# Patient Record
Sex: Female | Born: 1945 | Race: White | Hispanic: No | Marital: Married | Smoking: Former smoker
Health system: Southern US, Community
[De-identification: ages and names within clinical notes are randomized; demographics above are authoritative.]

## PROBLEM LIST (undated history)

## (undated) DIAGNOSIS — J189 Pneumonia, unspecified organism: Secondary | ICD-10-CM

## (undated) DIAGNOSIS — E119 Type 2 diabetes mellitus without complications: Secondary | ICD-10-CM

## (undated) DIAGNOSIS — M1990 Unspecified osteoarthritis, unspecified site: Secondary | ICD-10-CM

## (undated) DIAGNOSIS — I10 Essential (primary) hypertension: Secondary | ICD-10-CM

## (undated) DIAGNOSIS — K219 Gastro-esophageal reflux disease without esophagitis: Secondary | ICD-10-CM

## (undated) DIAGNOSIS — E039 Hypothyroidism, unspecified: Secondary | ICD-10-CM

## (undated) DIAGNOSIS — E079 Disorder of thyroid, unspecified: Secondary | ICD-10-CM

## (undated) DIAGNOSIS — E785 Hyperlipidemia, unspecified: Secondary | ICD-10-CM

## (undated) HISTORY — DX: Type 2 diabetes mellitus without complications: E11.9

## (undated) HISTORY — DX: Gastro-esophageal reflux disease without esophagitis: K21.9

## (undated) HISTORY — DX: Morbid (severe) obesity due to excess calories: E66.01

## (undated) HISTORY — DX: Essential (primary) hypertension: I10

## (undated) HISTORY — PX: BACK SURGERY: SHX140

## (undated) HISTORY — DX: Hyperlipidemia, unspecified: E78.5

## (undated) HISTORY — DX: Disorder of thyroid, unspecified: E07.9

---

## 2000-01-15 HISTORY — PX: ABDOMINAL HYSTERECTOMY: SHX81

## 2004-01-11 ENCOUNTER — Encounter: Admission: RE | Admit: 2004-01-11 | Discharge: 2004-01-11 | Payer: Self-pay | Admitting: *Deleted

## 2004-01-13 ENCOUNTER — Encounter: Admission: RE | Admit: 2004-01-13 | Discharge: 2004-01-13 | Payer: Self-pay | Admitting: *Deleted

## 2004-01-13 ENCOUNTER — Ambulatory Visit (HOSPITAL_COMMUNITY): Admission: RE | Admit: 2004-01-13 | Discharge: 2004-01-13 | Payer: Self-pay | Admitting: *Deleted

## 2004-01-20 ENCOUNTER — Ambulatory Visit (HOSPITAL_COMMUNITY): Admission: RE | Admit: 2004-01-20 | Discharge: 2004-01-20 | Payer: Self-pay | Admitting: *Deleted

## 2004-03-22 ENCOUNTER — Encounter: Admission: RE | Admit: 2004-03-22 | Discharge: 2004-06-20 | Payer: Self-pay | Admitting: *Deleted

## 2004-04-03 ENCOUNTER — Observation Stay (HOSPITAL_COMMUNITY): Admission: RE | Admit: 2004-04-03 | Discharge: 2004-04-04 | Payer: Self-pay | Admitting: *Deleted

## 2004-04-03 HISTORY — PX: LAPAROSCOPIC GASTRIC BANDING: SHX1100

## 2004-08-02 ENCOUNTER — Encounter: Admission: RE | Admit: 2004-08-02 | Discharge: 2004-10-31 | Payer: Self-pay | Admitting: *Deleted

## 2005-01-16 ENCOUNTER — Encounter: Admission: RE | Admit: 2005-01-16 | Discharge: 2005-04-16 | Payer: Self-pay | Admitting: *Deleted

## 2006-01-27 ENCOUNTER — Ambulatory Visit (HOSPITAL_COMMUNITY): Admission: RE | Admit: 2006-01-27 | Discharge: 2006-01-27 | Payer: Self-pay | Admitting: *Deleted

## 2008-11-08 HISTORY — PX: BLADDER SUSPENSION: SHX72

## 2011-07-25 ENCOUNTER — Telehealth (INDEPENDENT_AMBULATORY_CARE_PROVIDER_SITE_OTHER): Payer: Self-pay

## 2011-07-25 NOTE — Telephone Encounter (Signed)
Dr Harlin Heys request pt be seen asap in our office to have all fluid be removed from lap band. They are wanting pt to go to [facility] as a referral but pt must have all fluid removed from band prior to this appt. I advised Coralee North at Dr Luther Hearing office that we have not seen pt seen 2007 and I will have chart requested from storage and have a bariatric MD review chart and advise where to place pt in schedule. Coralee North can be reached at [phone].

## 2011-08-09 ENCOUNTER — Telehealth (INDEPENDENT_AMBULATORY_CARE_PROVIDER_SITE_OTHER): Payer: Self-pay

## 2011-08-09 NOTE — Telephone Encounter (Signed)
Left message advising patient that we have an opening this afternoon at 4:45pm due to a cancellation with Dr. Johna Sheriff

## 2011-08-13 ENCOUNTER — Telehealth (INDEPENDENT_AMBULATORY_CARE_PROVIDER_SITE_OTHER): Payer: Self-pay

## 2011-08-13 NOTE — Telephone Encounter (Signed)
Called Ms. Nicolosi to offer an appointment in our [facility] on 08/22/11, patient states she had all her fluid removed from a physician in [facility] and had her records transferred.  Patient is aware to call our office if needed.

## 2011-09-24 HISTORY — PX: SPLENECTOMY, TOTAL: SHX788

## 2011-09-24 HISTORY — PX: PANCREAS SURGERY: SHX731

## 2012-05-06 HISTORY — PX: LUNG SURGERY: SHX703

## 2012-05-20 HISTORY — PX: COLONOSCOPY: SHX174

## 2012-10-22 ENCOUNTER — Other Ambulatory Visit (INDEPENDENT_AMBULATORY_CARE_PROVIDER_SITE_OTHER): Payer: Self-pay

## 2012-10-22 ENCOUNTER — Encounter (INDEPENDENT_AMBULATORY_CARE_PROVIDER_SITE_OTHER): Payer: Self-pay | Admitting: Surgery

## 2012-10-22 ENCOUNTER — Ambulatory Visit (INDEPENDENT_AMBULATORY_CARE_PROVIDER_SITE_OTHER): Payer: Medicare Other | Admitting: Surgery

## 2012-10-22 VITALS — BP 156/88 | HR 80 | Temp 98.0°F | Resp 16 | Ht 65.0 in | Wt 239.2 lb

## 2012-10-22 DIAGNOSIS — E669 Obesity, unspecified: Secondary | ICD-10-CM

## 2012-10-22 DIAGNOSIS — R4702 Dysphasia: Secondary | ICD-10-CM

## 2012-10-22 DIAGNOSIS — K219 Gastro-esophageal reflux disease without esophagitis: Secondary | ICD-10-CM

## 2012-10-22 DIAGNOSIS — R109 Unspecified abdominal pain: Secondary | ICD-10-CM

## 2012-10-22 DIAGNOSIS — E7800 Pure hypercholesterolemia, unspecified: Secondary | ICD-10-CM

## 2012-10-22 DIAGNOSIS — Z9884 Bariatric surgery status: Secondary | ICD-10-CM

## 2012-10-22 NOTE — Progress Notes (Signed)
Chief Complaint:  Weight regain; failed lapband  History of Present Illness:  Carol Jackson is an 67 y.o. female who had a 10 cm Lapband in 2005.  She lost 85 lbs at her nadir but has had weight regain to within 27 lbs of her preop weight.  She is in the midst of her 6 month supervised weight loss.  She is going to curves and exercising regularly, she is going to diet-controlled centers and has not been able to lose any further weight.  Will begin workup that hopefully can culminate in her having a conversion of her lap band to a Roux-en-Y gastric bypass at the end of her 6 month weight loss supervision.   Past Medical History  Diagnosis Date  . Diabetes mellitus without complication   . GERD (gastroesophageal reflux disease)   . Hyperlipidemia   . Hypertension   . Thyroid disease     hypothyroidism    Past Surgical History  Procedure Laterality Date  . Laparoscopic gastric banding  04/03/04  . Abdominal hysterectomy  01/15/00  . Bladder suspension  11/08/08  . 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 Pancreas surgery  09/24/11    tail end removed  . Lung surgery  05/06/12    Current Outpatient Prescriptions  Medication Sig Dispense Refill  . amLODipine (NORVASC) 10 MG tablet Take 10 mg by mouth daily.      Marland Kitchen aspirin 81 MG tablet Take 81 mg by mouth daily.      . Cholecalciferol (VITAMIN D-3) 1000 UNITS CAPS Take by mouth.      . Cyanocobalamin (VITAMIN B-12) 2500 MCG SUBL Place under the tongue.      Marland Kitchen Fe Bisgly-Succ-C-Thre-B12-FA (IRON 21/7 PO) Take by mouth.      Marland Kitchen glucose blood test strip 1 each by Other route as needed for other. Use as instructed      . Krill Oil Omega-3 300 MG CAPS Take by mouth.      . Lancets MISC by Does not apply route.      Marland Kitchen levothyroxine (SYNTHROID, LEVOTHROID) 50 MCG tablet Take 50 mcg by mouth daily before breakfast.      . loratadine (CLARITIN) 10 MG tablet Take 10 mg by mouth daily.      . metFORMIN (GLUCOPHAGE) 500 MG tablet Take 500 mg by mouth 2  (two) times daily with a meal.      . metoCLOPramide (REGLAN) 10 MG tablet Take 10 mg by mouth 4 (four) times daily.      Marland Kitchen omeprazole (PRILOSEC) 20 MG capsule Take 20 mg by mouth daily.      . potassium chloride (KLOR-CON) 20 MEQ packet Take 20 mEq by mouth 2 (two) times daily.      . simvastatin (ZOCOR) 40 MG tablet Take 40 mg by mouth every evening.       No current facility-administered medications for this visit.   Morphine and related Family History  Problem Relation Age of Onset  . Heart disease Father    Social History:   reports that she has quit smoking. She does not have any smokeless tobacco history on file. She reports that  drinks alcohol. She reports that she does not use illicit drugs.   REVIEW OF SYSTEMS - PERTINENT POSITIVES ONLYhad pneumonia and sounds like a pleural effusion.  Physical Exam:   Blood pressure 156/88, pulse 80, temperature 98 F (36.7 C), temperature source Temporal, resp. rate 16, height 5\' 5"  (1.651 m), weight 239 lb 3.2 oz (  108.5 kg). Body mass index is 39.8 kg/(m^2).  Gen:  WDWN white female NAD  Neurological: Alert and oriented to person, place, and time. Motor and sensory function is grossly intact  Head: Normocephalic and atraumatic.  Eyes: Conjunctivae are normal. Pupils are equal, round, and reactive to light. No scleral icterus.  Neck: Normal range of motion. Neck supple. No tracheal deviation or thyromegaly present.  Cardiovascular:  SR without murmurs or gallops.  No carotid bruits Respiratory: Effort normal.  No respiratory distress. No chest wall tenderness. Breath sounds normal.  No wheezes, rales or rhonchi.  Abdomen:  nontender GU: Musculoskeletal: Normal range of motion. Extremities are nontender. No cyanosis, edema or clubbing noted Lymphadenopathy: No cervical, preauricular, postauricular or axillary adenopathy is present Skin: Skin is warm and dry. No rash noted. No diaphoresis. No erythema. No pallor. Pscyh: Normal mood and  affect. Behavior is normal. Judgment and thought content normal.   LABORATORY RESULTS: No results found for this or any previous visit (from the past 48 hour(s)).  RADIOLOGY RESULTS: No results found.  Problem List: There are no active problems to display for this patient.   Assessment & Plan: LAP-BAND failure.  To begin workup for conversion to Roux-en-Y gastric bypass    Matt B. Daphine Deutscher, MD, [facility] [phone] beeper [phone]  10/22/2012 10:26 AM

## 2012-10-22 NOTE — Patient Instructions (Signed)

## 2012-11-12 ENCOUNTER — Encounter (INDEPENDENT_AMBULATORY_CARE_PROVIDER_SITE_OTHER): Payer: Self-pay

## 2012-11-24 ENCOUNTER — Ambulatory Visit: Payer: Self-pay | Admitting: *Deleted

## 2012-11-26 ENCOUNTER — Ambulatory Visit (HOSPITAL_COMMUNITY)
Admission: RE | Admit: 2012-11-26 | Discharge: 2012-11-26 | Disposition: A | Discharge status: Home / Self Care | Payer: Medicare Other | Source: Ambulatory Visit | Attending: Surgery | Admitting: Surgery

## 2012-11-26 ENCOUNTER — Other Ambulatory Visit: Payer: Self-pay

## 2012-11-26 ENCOUNTER — Ambulatory Visit (HOSPITAL_COMMUNITY): Payer: Medicare Other

## 2012-11-26 DIAGNOSIS — K7689 Other specified diseases of liver: Secondary | ICD-10-CM | POA: Insufficient documentation

## 2012-11-26 DIAGNOSIS — E785 Hyperlipidemia, unspecified: Secondary | ICD-10-CM | POA: Insufficient documentation

## 2012-11-26 DIAGNOSIS — Z90411 Acquired partial absence of pancreas: Secondary | ICD-10-CM | POA: Insufficient documentation

## 2012-11-26 DIAGNOSIS — I10 Essential (primary) hypertension: Secondary | ICD-10-CM | POA: Insufficient documentation

## 2012-11-26 DIAGNOSIS — K219 Gastro-esophageal reflux disease without esophagitis: Secondary | ICD-10-CM | POA: Insufficient documentation

## 2012-11-26 DIAGNOSIS — E119 Type 2 diabetes mellitus without complications: Secondary | ICD-10-CM | POA: Insufficient documentation

## 2012-11-26 DIAGNOSIS — K228 Other specified diseases of esophagus: Secondary | ICD-10-CM | POA: Insufficient documentation

## 2012-11-26 DIAGNOSIS — Z6839 Body mass index (BMI) 39.0-39.9, adult: Secondary | ICD-10-CM | POA: Insufficient documentation

## 2012-11-26 DIAGNOSIS — J90 Pleural effusion, not elsewhere classified: Secondary | ICD-10-CM | POA: Insufficient documentation

## 2012-11-26 DIAGNOSIS — K449 Diaphragmatic hernia without obstruction or gangrene: Secondary | ICD-10-CM | POA: Insufficient documentation

## 2012-11-26 DIAGNOSIS — E039 Hypothyroidism, unspecified: Secondary | ICD-10-CM | POA: Insufficient documentation

## 2012-11-26 DIAGNOSIS — K2289 Other specified disease of esophagus: Secondary | ICD-10-CM | POA: Insufficient documentation

## 2012-11-26 DIAGNOSIS — Z9089 Acquired absence of other organs: Secondary | ICD-10-CM | POA: Insufficient documentation

## 2012-12-02 ENCOUNTER — Encounter (INDEPENDENT_AMBULATORY_CARE_PROVIDER_SITE_OTHER): Payer: Self-pay

## 2012-12-03 ENCOUNTER — Encounter: Payer: Self-pay | Admitting: *Deleted

## 2012-12-03 ENCOUNTER — Encounter: Payer: Medicare Other | Attending: Surgery | Admitting: *Deleted

## 2012-12-03 VITALS — Ht 65.0 in | Wt 241.6 lb

## 2012-12-03 DIAGNOSIS — Z713 Dietary counseling and surveillance: Secondary | ICD-10-CM | POA: Insufficient documentation

## 2012-12-03 DIAGNOSIS — E669 Obesity, unspecified: Secondary | ICD-10-CM | POA: Insufficient documentation

## 2012-12-03 NOTE — Progress Notes (Signed)
  Pre-Op Assessment Visit:  Pre-Operative RYGB Surgery  Medical Nutrition Therapy:  Appt start time: 0830   End time:  0930.  Patient was seen on 12/03/2012 for Pre-Operative Gastric Bypass Nutrition Assessment. Assessment and letter of approval faxed to [facility] Bariatric Surgery Program coordinator on 12/03/2012.   Handouts given during visit include:  Pre-Op Goals Bariatric Surgery Protein Shakes  Patient to call the [facility] to enroll in Pre-Op and Post-Op Nutrition Education when surgery date is scheduled.

## 2012-12-03 NOTE — Patient Instructions (Addendum)
   Follow Pre-Op Nutrition Goals to prepare for Gastric Bypass Surgery.   Call the [facility] at [phone] once you have been given your surgery date to enrolled in the Pre-Op Nutrition Class. You will need to attend this nutrition class 3-4 weeks prior to your surgery. 

## 2013-02-16 ENCOUNTER — Encounter (INDEPENDENT_AMBULATORY_CARE_PROVIDER_SITE_OTHER): Payer: Self-pay

## 2013-09-29 ENCOUNTER — Encounter (INDEPENDENT_AMBULATORY_CARE_PROVIDER_SITE_OTHER): Payer: Self-pay | Admitting: Surgery

## 2013-09-29 ENCOUNTER — Ambulatory Visit (INDEPENDENT_AMBULATORY_CARE_PROVIDER_SITE_OTHER): Payer: Medicare Other | Admitting: Surgery

## 2013-09-29 VITALS — BP 136/100 | HR 88 | Temp 97.8°F | Ht 66.0 in | Wt 249.0 lb

## 2013-09-29 DIAGNOSIS — E891 Postprocedural hypoinsulinemia: Secondary | ICD-10-CM

## 2013-09-29 DIAGNOSIS — E139 Other specified diabetes mellitus without complications: Secondary | ICD-10-CM | POA: Insufficient documentation

## 2013-09-29 DIAGNOSIS — E119 Type 2 diabetes mellitus without complications: Secondary | ICD-10-CM

## 2013-09-29 DIAGNOSIS — Z90410 Acquired total absence of pancreas: Secondary | ICD-10-CM

## 2013-09-29 DIAGNOSIS — L905 Scar conditions and fibrosis of skin: Secondary | ICD-10-CM | POA: Insufficient documentation

## 2013-09-29 NOTE — Progress Notes (Signed)
Chief Complaint:  Obesity, DM, and failed lapband  History of Present Illness:  Carol Jackson is an 68 y.o. female who underwent Lapband surgery in 2005 and then the long-term has failed. Current weight is 249 despite 6 months supervised weight loss. 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 Subsequently she developed an empyema on the right side and underwent a decortication to wait for as well.  She's researched this l on the Internet about that conversion and wants to go and have her band removed and converted to Roux-en-Y gastric bypass. I explained this is a more complicated and he does have risks but she feels this is necessary and I agree.  Past Medical History  Diagnosis Date  . Diabetes mellitus without complication   . GERD (gastroesophageal reflux disease)   . Hyperlipidemia   . Hypertension   . Thyroid disease     hypothyroidism  . Morbid obesity     Past Surgical History  Procedure Laterality Date  . Laparoscopic gastric banding  04/03/04  . Abdominal hysterectomy  01/15/00  . Bladder suspension  11/08/08  . 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 Pancreas surgery  09/24/11    tail end removed  . Lung surgery  05/06/12    Current Outpatient Prescriptions  Medication Sig Dispense Refill  . amLODipine (NORVASC) 10 MG tablet Take 10 mg by mouth daily.      Marland Kitchen aspirin 81 MG tablet Take 81 mg by mouth daily.      . Cholecalciferol (VITAMIN D-3) 1000 UNITS CAPS Take by mouth.      . Cyanocobalamin (VITAMIN B-12) 2500 MCG SUBL Place under the tongue.      Marland Kitchen Fe Bisgly-Succ-C-Thre-B12-FA (IRON 21/7 PO) Take 27 mg by mouth daily.       Marland Kitchen glucose blood test strip 1 each by Other route as needed for other. Use as instructed      . Krill Oil Omega-3 300 MG CAPS Take by mouth.      . Lancets MISC by Does not apply route.      Marland Kitchen levothyroxine (SYNTHROID, LEVOTHROID) 50 MCG  tablet Take 50 mcg by mouth daily before breakfast.      . loratadine (CLARITIN) 10 MG tablet Take 10 mg by mouth daily.      . metFORMIN (GLUCOPHAGE) 500 MG tablet Take 500 mg by mouth 2 (two) times daily with a meal.      . metoCLOPramide (REGLAN) 10 MG tablet Take 10 mg by mouth 4 (four) times daily.      Marland Kitchen omeprazole (PRILOSEC) 20 MG capsule Take 20 mg by mouth daily.      . potassium chloride (KLOR-CON) 20 MEQ packet Take 20 mEq by mouth 2 (two) times daily.      . simvastatin (ZOCOR) 40 MG tablet Take 40 mg by mouth every evening.       No current facility-administered medications for this visit.   Morphine and related and Levaquin Family History  Problem Relation Age of Onset  . Heart disease Father    Social History:   reports that she quit smoking about 29 years ago. Her smoking use included Cigarettes. She smoked 0.00 packs per day. She does not have any smokeless tobacco history on file. She reports that she drinks alcohol. She reports that she does not use illicit drugs.   REVIEW OF SYSTEMS - PERTINENT POSITIVES ONLY: Negative for DVT  Physical  Exam:   Blood pressure 136/100, pulse 88, temperature 97.8 F (36.6 C), height 5\' 6"  (1.676 m), weight 249 lb (112.946 kg). Body mass index is 40.21 kg/(m^2).  Gen:  WDWN WF NAD  Neurological: Alert and oriented to person, place, and time. Motor and sensory function is grossly intact  Head: Normocephalic and atraumatic.  Eyes: Conjunctivae are normal. Pupils are equal, round, and reactive to light. No scleral icterus.  Neck: Normal range of motion. Neck supple. No tracheal deviation or thyromegaly present.  Cardiovascular:  SR without murmurs or gallops.  No carotid bruits Respiratory: Effort normal.  No respiratory distress. No chest wall tenderness. Breath sounds normal.  No wheezes, rales or rhonchi.  Abdomen:  nontender with multiple laparoscopic scars.   GU: Musculoskeletal: Normal range of motion. Extremities are nontender.  No cyanosis, edema or clubbing noted Lymphadenopathy: No cervical, preauricular, postauricular or axillary adenopathy is present Skin: Skin is warm and dry. No rash noted. No diaphoresis. No erythema. No pallor. Pscyh: Normal mood and affect. Behavior is normal. Judgment and thought content normal.   LABORATORY RESULTS: No results found for this or any previous visit (from the past 48 hour(s)).  RADIOLOGY RESULTS: No results found.  Problem List: Patient Active Problem List   Diagnosis Date Noted  . Diabetes 09/29/2013  . Thoracotomy scar of right chest-decortication at [facility] 09/29/2013  . Post-pancreatectomy diabetes-splenectomy at [facility] 09/29/2013  . 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 Obesity, unspecified 10/22/2012    Assessment & Plan: Failed 10 cm lapband desiring conversion to lap roux en y gastric bypass.  Anticipate scarring in upper abdomen from splenectomy but will move forward with conversion to roux Y    Matt B. Hassell Done, MD, [facility] [phone] beeper [phone]  09/29/2013 2:46 PM

## 2013-09-29 NOTE — Patient Instructions (Addendum)
Two weeks prior to surgery  Go on the extremely low carb liquid diet One week prior to surgery  No aspirin products.  Tylenol is acceptable  Stop smoking 24 hours prior to surgery  No alcoholic beverages  Report fever greater than 100.5 or excessive nasal drainage suggesting infection  Continue bariatric preop diet  Perform bowel prep if ordered  Do not eat or drink anything after midnight the night before surgery  Do not take any medications except those instructed by the anesthesiologist Morning of surgery  Please arrive at the hospital at least 2 hours before your scheduled surgery time.  No makeup, fingernail polish or jewelry  Bring insurance cards with you  Bring your CPAP mask if you use this  Removal of your band and conversion to roux Y gastric bypass carries higher risk of possible conversion to open procedure and for other potential complications.  It will require more time in the operating room and likely at least one night stay in the stepdown unit.

## 2013-10-04 ENCOUNTER — Encounter: Payer: Medicare Other | Attending: Surgery

## 2013-10-04 VITALS — Ht 65.0 in | Wt 250.5 lb

## 2013-10-04 DIAGNOSIS — Z713 Dietary counseling and surveillance: Secondary | ICD-10-CM | POA: Insufficient documentation

## 2013-10-04 DIAGNOSIS — Z01818 Encounter for other preprocedural examination: Secondary | ICD-10-CM | POA: Insufficient documentation

## 2013-10-04 DIAGNOSIS — E669 Obesity, unspecified: Secondary | ICD-10-CM | POA: Diagnosis present

## 2013-10-04 DIAGNOSIS — E119 Type 2 diabetes mellitus without complications: Secondary | ICD-10-CM | POA: Insufficient documentation

## 2013-10-04 NOTE — Progress Notes (Signed)
Dr. Hassell Done - Please enter preop orders in Epic for Carol Jackson - surg date 6/2.  Thanks.

## 2013-10-04 NOTE — Progress Notes (Signed)
  Pre-Operative Nutrition Class:  Appt start time: 830   End time:  1030.  Patient was seen on 10/04/2013 for Pre-Operative Bariatric Surgery Education at the [facility].   Surgery date: 10/19/2013 Surgery type: RYGB Start weight at [facility]: 241.5 lbs on 12/03/12  Weight today: 250.5 lbs  TANITA  BODY COMP RESULTS  10/04/2013   BMI (kg/m^2) 41.7   Fat Mass (lbs) 135   Fat Free Mass (lbs) 115.5   Total Body Water (lbs) 84.5   Samples given per MNT protocol. Patient educated on appropriate usage: Unjury protein powder (unflavored - qty 1) Lot #: [id number] Exp: 10/2014  Bariactiv Calcium Citrate (qty 1) Lot #:[id number] S Exp:10/2014  Bariactiv Multivitamin (qty 1) Lot #:[id number] S Exp:09/2014  Premier protein shake (chocolate - qty 1) Lot #[id number] Exp: 06/2014  The following the learning objectives were met by the patient during this course:  Identify Pre-Op Dietary Goals and will begin 2 weeks pre-operatively  Identify appropriate sources of fluids and proteins   State protein recommendations and appropriate sources pre and post-operatively  Identify Post-Operative Dietary Goals and will follow for 2 weeks post-operatively  Identify appropriate multivitamin and calcium sources  Describe the need for physical activity post-operatively and will follow MD recommendations  State when to call healthcare provider regarding medication questions or post-operative complications  Handouts given during class include:  Pre-Op Bariatric Surgery Diet Handout  Protein Shake Handout  Post-Op Bariatric Surgery Nutrition Handout  BELT Program Information Flyer  Support Group Information Flyer  [facility] Bariatric Supplements Price List  Follow-Up Plan: Patient will follow-up at [facility] 2 weeks post operatively for diet advancement per MD.

## 2013-10-05 ENCOUNTER — Encounter (HOSPITAL_COMMUNITY): Payer: Self-pay | Admitting: Pharmacy Technician

## 2013-10-07 ENCOUNTER — Telehealth: Payer: Self-pay | Admitting: Dietician

## 2013-10-07 NOTE — Progress Notes (Signed)
DR. Hassell Done - PLEASE ENTER PREOP ORDERS IN EPIC FOR Brett Kaiser - HER SURG DATE IS 6/2 AND SHE IS COMING TO [facility] FOR PREOP ON 5/26.  THANKS.

## 2013-10-07 NOTE — Telephone Encounter (Signed)
Email to me on 10/07/2013: Didn't ask the other day, but are sweet potatoes allowed before?   Thanks,    Carol Jackson  My reply on 10/07/2013: Hi Ms. Viernes, Sweet potatoes are too starchy for the pre op diet.  Thanks! Magda Paganini

## 2013-10-08 NOTE — Patient Instructions (Signed)
Carol Jackson  10/08/2013   Your procedure is scheduled on:  10/19/2013  0830am-1150am  Report to [facility] at     [place]  AM.  Call this number if you have problems the morning of surgery: [phone]   Remember:   Do not eat food or drink liquids after midnight.   Take these medicines the morning of surgery with A SIP OF WATER:    Do not wear jewelry, make-up or nail polish.  Do not wear lotions, powders, or perfumes.   Do not shave 48 hours prior to surgery.   Do not bring valuables to the hospital.  Contacts, dentures or bridgework may not be worn into surgery.  Leave suitcase in the car. After surgery it may be brought to your room.  For patients admitted to the hospital, checkout time is 11:00 AM the day of  discharge.          Please read over the following fact sheets that you were given: [facility] - Preparing for Surgery Before surgery, you can play an important role.  Because skin is not sterile, your skin needs to be as free of germs as possible.  You can reduce the number of germs on your skin by washing with CHG (chlorahexidine gluconate) soap before surgery.  CHG is an antiseptic cleaner which kills germs and bonds with the skin to continue killing germs even after washing. Please DO NOT use if you have an allergy to CHG or antibacterial soaps.  If your skin becomes reddened/irritated stop using the CHG and inform your nurse when you arrive at Short Stay. Do not shave (including legs and underarms) for at least 48 hours prior to the first CHG shower.  You may shave your face/neck. Please follow these instructions carefully:  1.  Shower with CHG Soap the night before surgery and the  morning of Surgery.  2.  If you choose to wash your hair, wash your hair first as usual with your  normal  shampoo.  3.  After you shampoo, rinse your hair and body thoroughly to remove the  shampoo.                           4.  Use CHG as you would any other liquid soap.   You can apply chg directly  to the skin and wash                       Gently with a scrungie or clean washcloth.  5.  Apply the CHG Soap to your body ONLY FROM THE NECK DOWN.   Do not use on face/ open                           Wound or open sores. Avoid contact with eyes, ears mouth and genitals (private parts).                       Wash face,  Genitals (private parts) with your normal soap.             6.  Wash thoroughly, paying special attention to the area where your surgery  will be performed.  7.  Thoroughly rinse your body with warm water from the neck down.  8.  DO NOT shower/wash with your normal soap after using and rinsing off  the CHG  Soap.                9.  Pat yourself dry with a clean towel.            10.  Wear clean pajamas.            11.  Place clean sheets on your bed the night of your first shower and do not  sleep with pets. Day of Surgery : Do not apply any lotions/deodorants the morning of surgery.  Please wear clean clothes to the hospital/surgery center.  FAILURE TO FOLLOW THESE INSTRUCTIONS MAY RESULT IN THE CANCELLATION OF YOUR SURGERY PATIENT SIGNATURE_________________________________  NURSE SIGNATURE__________________________________  ________________________________________________________________________  coughing and deep breathing exercises, leg exercises

## 2013-10-12 ENCOUNTER — Encounter (HOSPITAL_COMMUNITY)
Admission: RE | Admit: 2013-10-12 | Discharge: 2013-10-12 | Disposition: A | Discharge status: Home / Self Care | Payer: Medicare Other | Source: Ambulatory Visit | Attending: Surgery | Admitting: Surgery

## 2013-10-12 ENCOUNTER — Encounter (HOSPITAL_COMMUNITY): Payer: Self-pay

## 2013-10-12 DIAGNOSIS — Z01812 Encounter for preprocedural laboratory examination: Secondary | ICD-10-CM | POA: Insufficient documentation

## 2013-10-12 HISTORY — DX: Unspecified osteoarthritis, unspecified site: M19.90

## 2013-10-12 HISTORY — DX: Hypothyroidism, unspecified: E03.9

## 2013-10-12 HISTORY — DX: Pneumonia, unspecified organism: J18.9

## 2013-10-12 LAB — CBC
HEMATOCRIT: 46.3 % — AB (ref 36.0–46.0)
Hemoglobin: 16 g/dL — ABNORMAL HIGH (ref 12.0–15.0)
MCH: 29 pg (ref 26.0–34.0)
MCHC: 34.6 g/dL (ref 30.0–36.0)
MCV: 84 fL (ref 78.0–100.0)
Platelets: 321 10*3/uL (ref 150–400)
RBC: 5.51 MIL/uL — ABNORMAL HIGH (ref 3.87–5.11)
RDW: 15 % (ref 11.5–15.5)
WBC: 9.4 10*3/uL (ref 4.0–10.5)

## 2013-10-12 LAB — BASIC METABOLIC PANEL
BUN: 18 mg/dL (ref 6–23)
CO2: 23 mEq/L (ref 19–32)
Calcium: 9.7 mg/dL (ref 8.4–10.5)
Chloride: 102 mEq/L (ref 96–112)
Creatinine, Ser: 0.66 mg/dL (ref 0.50–1.10)
GFR calc Af Amer: >90 mL/min (ref >90)
GFR calc non Af Amer: >90 mL/min (ref >90)
Glucose, Bld: 112 mg/dL — ABNORMAL HIGH (ref 70–99)
Potassium: 3.8 mEq/L (ref 3.7–5.3)
Sodium: 141 mEq/L (ref 137–147)

## 2013-10-12 NOTE — Progress Notes (Signed)
Preop on 10/12/13.  Surgery on 10/19/2013.  Need orders in EPIC.  Thank You.

## 2013-10-18 ENCOUNTER — Other Ambulatory Visit (INDEPENDENT_AMBULATORY_CARE_PROVIDER_SITE_OTHER): Payer: Self-pay | Admitting: Surgery

## 2013-10-19 ENCOUNTER — Inpatient Hospital Stay (HOSPITAL_COMMUNITY)
Admission: RE | Admit: 2013-10-19 | Discharge: 2013-11-09 | DRG: 326 | Disposition: A | Discharge status: Home Health | Payer: Medicare Other | Source: Ambulatory Visit | Attending: Surgery | Admitting: Surgery

## 2013-10-19 ENCOUNTER — Encounter (HOSPITAL_COMMUNITY): Payer: Medicare Other | Admitting: Certified Registered Nurse Anesthetist

## 2013-10-19 ENCOUNTER — Encounter (HOSPITAL_COMMUNITY)
Admission: RE | Disposition: A | Discharge status: Home Health | Payer: Self-pay | Source: Ambulatory Visit | Attending: Surgery

## 2013-10-19 ENCOUNTER — Encounter (HOSPITAL_COMMUNITY): Payer: Self-pay | Admitting: *Deleted

## 2013-10-19 ENCOUNTER — Inpatient Hospital Stay (HOSPITAL_COMMUNITY): Payer: Medicare Other | Admitting: Certified Registered Nurse Anesthetist

## 2013-10-19 DIAGNOSIS — Z87891 Personal history of nicotine dependence: Secondary | ICD-10-CM

## 2013-10-19 DIAGNOSIS — Z933 Colostomy status: Secondary | ICD-10-CM

## 2013-10-19 DIAGNOSIS — Z79899 Other long term (current) drug therapy: Secondary | ICD-10-CM

## 2013-10-19 DIAGNOSIS — Y833 Surgical operation with formation of external stoma as the cause of abnormal reaction of the patient, or of later complication, without mention of misadventure at the time of the procedure: Secondary | ICD-10-CM | POA: Diagnosis not present

## 2013-10-19 DIAGNOSIS — E039 Hypothyroidism, unspecified: Secondary | ICD-10-CM | POA: Diagnosis present

## 2013-10-19 DIAGNOSIS — K56609 Unspecified intestinal obstruction, unspecified as to partial versus complete obstruction: Secondary | ICD-10-CM | POA: Diagnosis not present

## 2013-10-19 DIAGNOSIS — E876 Hypokalemia: Secondary | ICD-10-CM | POA: Diagnosis not present

## 2013-10-19 DIAGNOSIS — Y838 Other surgical procedures as the cause of abnormal reaction of the patient, or of later complication, without mention of misadventure at the time of the procedure: Secondary | ICD-10-CM | POA: Diagnosis present

## 2013-10-19 DIAGNOSIS — E871 Hypo-osmolality and hyponatremia: Secondary | ICD-10-CM | POA: Diagnosis not present

## 2013-10-19 DIAGNOSIS — D131 Benign neoplasm of stomach: Secondary | ICD-10-CM | POA: Diagnosis present

## 2013-10-19 DIAGNOSIS — Z881 Allergy status to other antibiotic agents status: Secondary | ICD-10-CM

## 2013-10-19 DIAGNOSIS — Z6841 Body Mass Index (BMI) 40.0 and over, adult: Secondary | ICD-10-CM

## 2013-10-19 DIAGNOSIS — E119 Type 2 diabetes mellitus without complications: Secondary | ICD-10-CM

## 2013-10-19 DIAGNOSIS — K9509 Other complications of gastric band procedure: Principal | ICD-10-CM | POA: Diagnosis present

## 2013-10-19 DIAGNOSIS — K2100 Gastro-esophageal reflux disease with esophagitis, without bleeding: Secondary | ICD-10-CM

## 2013-10-19 DIAGNOSIS — Z8249 Family history of ischemic heart disease and other diseases of the circulatory system: Secondary | ICD-10-CM

## 2013-10-19 DIAGNOSIS — K219 Gastro-esophageal reflux disease without esophagitis: Secondary | ICD-10-CM | POA: Diagnosis present

## 2013-10-19 DIAGNOSIS — I10 Essential (primary) hypertension: Secondary | ICD-10-CM | POA: Diagnosis present

## 2013-10-19 DIAGNOSIS — Z9884 Bariatric surgery status: Secondary | ICD-10-CM

## 2013-10-19 DIAGNOSIS — N170 Acute kidney failure with tubular necrosis: Secondary | ICD-10-CM | POA: Diagnosis not present

## 2013-10-19 DIAGNOSIS — I9589 Other hypotension: Secondary | ICD-10-CM | POA: Diagnosis not present

## 2013-10-19 DIAGNOSIS — Z5331 Laparoscopic surgical procedure converted to open procedure: Secondary | ICD-10-CM

## 2013-10-19 DIAGNOSIS — E785 Hyperlipidemia, unspecified: Secondary | ICD-10-CM | POA: Diagnosis present

## 2013-10-19 DIAGNOSIS — Y9210 Unspecified residential institution as the place of occurrence of the external cause: Secondary | ICD-10-CM | POA: Diagnosis not present

## 2013-10-19 DIAGNOSIS — Z885 Allergy status to narcotic agent status: Secondary | ICD-10-CM

## 2013-10-19 DIAGNOSIS — E872 Acidosis, unspecified: Secondary | ICD-10-CM | POA: Diagnosis not present

## 2013-10-19 HISTORY — PX: GASTRIC ROUX-EN-Y: SHX5262

## 2013-10-19 LAB — CBC
HCT: 44.5 % (ref 36.0–46.0)
HEMOGLOBIN: 15.4 g/dL — AB (ref 12.0–15.0)
MCH: 29.1 pg (ref 26.0–34.0)
MCHC: 34.6 g/dL (ref 30.0–36.0)
MCV: 84 fL (ref 78.0–100.0)
Platelets: 295 10*3/uL (ref 150–400)
RBC: 5.3 MIL/uL — ABNORMAL HIGH (ref 3.87–5.11)
RDW: 15.4 % (ref 11.5–15.5)
WBC: 25.3 10*3/uL — ABNORMAL HIGH (ref 4.0–10.5)

## 2013-10-19 LAB — CREATININE, SERUM
Creatinine, Ser: 0.61 mg/dL (ref 0.50–1.10)
GFR calc Af Amer: >90 mL/min (ref >90)
GFR calc non Af Amer: >90 mL/min (ref >90)

## 2013-10-19 LAB — GLUCOSE, CAPILLARY
GLUCOSE-CAPILLARY: 199 mg/dL — AB (ref 70–99)
Glucose-Capillary: 128 mg/dL — ABNORMAL HIGH (ref 70–99)
Glucose-Capillary: 221 mg/dL — ABNORMAL HIGH (ref 70–99)
Glucose-Capillary: 161 mg/dL — ABNORMAL HIGH (ref 70–99)

## 2013-10-19 LAB — MRSA PCR SCREENING: MRSA by PCR: NEGATIVE

## 2013-10-19 SURGERY — LAPAROSCOPIC ROUX-EN-Y GASTRIC BYPASS WITH UPPER ENDOSCOPY
Anesthesia: General | Site: Abdomen

## 2013-10-19 MED ORDER — FENTANYL CITRATE 0.05 MG/ML IJ SOLN
INTRAMUSCULAR | Status: AC
  Filled 2013-10-19: qty 2

## 2013-10-19 MED ORDER — CHLORHEXIDINE GLUCONATE CLOTH 2 % EX PADS: 6.0000 | MEDICATED_PAD | Freq: Once | CUTANEOUS | Status: DC

## 2013-10-19 MED ORDER — MIDAZOLAM HCL 2 MG/2ML IJ SOLN
INTRAMUSCULAR | Status: AC
  Filled 2013-10-19: qty 2

## 2013-10-19 MED ORDER — CEFOXITIN SODIUM 2 G IV SOLR
2.0000 g | INTRAVENOUS | Status: AC
  Administered 2013-10-19 (×3): 2 g via INTRAVENOUS

## 2013-10-19 MED ORDER — BUPIVACAINE LIPOSOME 1.3 % IJ SUSP
INTRAMUSCULAR | Status: DC | PRN
  Administered 2013-10-19: 20 mL

## 2013-10-19 MED ORDER — ACETAMINOPHEN 160 MG/5ML PO SOLN: 650.0000 mg | ORAL | Status: DC | PRN

## 2013-10-19 MED ORDER — SUCCINYLCHOLINE CHLORIDE 20 MG/ML IJ SOLN
INTRAMUSCULAR | Status: DC | PRN
  Administered 2013-10-19: 140 mg via INTRAVENOUS

## 2013-10-19 MED ORDER — LIDOCAINE HCL (CARDIAC) 20 MG/ML IV SOLN
INTRAVENOUS | Status: AC
  Filled 2013-10-19: qty 5

## 2013-10-19 MED ORDER — UNJURY CHOCOLATE CLASSIC POWDER
2.0000 [oz_av] | Freq: Four times a day (QID) | ORAL | Status: DC
  Administered 2013-10-21 – 2013-10-23 (×4): 2 [oz_av] via ORAL
  Filled 2013-10-19 (×18): qty 27

## 2013-10-19 MED ORDER — DEXAMETHASONE SODIUM PHOSPHATE 10 MG/ML IJ SOLN
INTRAMUSCULAR | Status: AC
  Filled 2013-10-19: qty 1

## 2013-10-19 MED ORDER — ONDANSETRON HCL 4 MG/2ML IJ SOLN
4.0000 mg | INTRAMUSCULAR | Status: DC | PRN
  Administered 2013-10-21 – 2013-10-24 (×10): 4 mg via INTRAVENOUS
  Filled 2013-10-19 (×10): qty 2

## 2013-10-19 MED ORDER — KCL IN DEXTROSE-NACL 20-5-0.45 MEQ/L-%-% IV SOLN
INTRAVENOUS | Status: DC
  Administered 2013-10-19 – 2013-10-23 (×9): via INTRAVENOUS
  Administered 2013-10-23: 125 mL/h via INTRAVENOUS
  Administered 2013-10-24: 04:00:00 via INTRAVENOUS
  Filled 2013-10-19 (×12): qty 1000

## 2013-10-19 MED ORDER — PROPOFOL 10 MG/ML IV BOLUS
INTRAVENOUS | Status: AC
  Filled 2013-10-19: qty 20

## 2013-10-19 MED ORDER — FENTANYL CITRATE 0.05 MG/ML IJ SOLN
INTRAMUSCULAR | Status: AC
  Filled 2013-10-19: qty 5

## 2013-10-19 MED ORDER — PROPOFOL 10 MG/ML IV BOLUS
INTRAVENOUS | Status: DC | PRN
  Administered 2013-10-19: 230 mg via INTRAVENOUS

## 2013-10-19 MED ORDER — NEOSTIGMINE METHYLSULFATE 10 MG/10ML IV SOLN
INTRAVENOUS | Status: AC
  Filled 2013-10-19: qty 1

## 2013-10-19 MED ORDER — EVICEL 5 ML EX KIT
PACK | CUTANEOUS | Status: DC | PRN
  Administered 2013-10-19: 1

## 2013-10-19 MED ORDER — NEOSTIGMINE METHYLSULFATE 10 MG/10ML IV SOLN
INTRAVENOUS | Status: DC | PRN
  Administered 2013-10-19: 5 mg via INTRAVENOUS

## 2013-10-19 MED ORDER — MEPERIDINE HCL 50 MG/ML IJ SOLN: 6.2500 mg | INTRAMUSCULAR | Status: DC | PRN

## 2013-10-19 MED ORDER — MIDAZOLAM HCL 5 MG/5ML IJ SOLN
INTRAMUSCULAR | Status: DC | PRN
  Administered 2013-10-19: 2 mg via INTRAVENOUS

## 2013-10-19 MED ORDER — PHENYLEPHRINE HCL 10 MG/ML IJ SOLN
INTRAMUSCULAR | Status: DC | PRN
  Administered 2013-10-19 (×3): 80 ug via INTRAVENOUS

## 2013-10-19 MED ORDER — DEXTROSE 5 % IV SOLN
INTRAVENOUS | Status: AC
  Filled 2013-10-19: qty 2

## 2013-10-19 MED ORDER — OXYCODONE HCL 5 MG/5ML PO SOLN
5.0000 mg | ORAL | Status: DC | PRN
  Administered 2013-10-20 – 2013-10-21 (×3): 5 mg via ORAL
  Administered 2013-10-24: 10 mg via ORAL
  Administered 2013-10-25: 5 mg via ORAL
  Administered 2013-10-27: 10 mg via ORAL
  Administered 2013-10-27 – 2013-10-28 (×3): 5 mg via ORAL
  Administered 2013-10-28 – 2013-10-30 (×6): 10 mg via ORAL
  Administered 2013-10-31 (×2): 5 mg via ORAL
  Administered 2013-10-31 (×2): 10 mg via ORAL
  Administered 2013-10-31: 5 mg via ORAL
  Administered 2013-10-31 – 2013-11-04 (×9): 10 mg via ORAL
  Administered 2013-11-05 – 2013-11-08 (×2): 5 mg via ORAL
  Filled 2013-10-19: qty 10
  Filled 2013-10-19 (×2): qty 5
  Filled 2013-10-19 (×4): qty 10
  Filled 2013-10-19 (×2): qty 5
  Filled 2013-10-19 (×5): qty 10
  Filled 2013-10-19: qty 25
  Filled 2013-10-19 (×2): qty 10
  Filled 2013-10-19: qty 50
  Filled 2013-10-19: qty 5
  Filled 2013-10-19: qty 10
  Filled 2013-10-19: qty 5
  Filled 2013-10-19: qty 10
  Filled 2013-10-19 (×4): qty 5
  Filled 2013-10-19: qty 50
  Filled 2013-10-19 (×2): qty 5
  Filled 2013-10-19 (×2): qty 10
  Filled 2013-10-19 (×2): qty 5

## 2013-10-19 MED ORDER — EPHEDRINE SULFATE 50 MG/ML IJ SOLN
INTRAMUSCULAR | Status: DC | PRN
Start: 2013-10-19 — End: 2013-10-19
  Administered 2013-10-19: 10 mg via INTRAVENOUS

## 2013-10-19 MED ORDER — BUPIVACAINE-EPINEPHRINE (PF) 0.25% -1:200000 IJ SOLN
INTRAMUSCULAR | Status: AC
  Filled 2013-10-19: qty 30

## 2013-10-19 MED ORDER — KCL IN DEXTROSE-NACL 20-5-0.45 MEQ/L-%-% IV SOLN
INTRAVENOUS | Status: AC
  Filled 2013-10-19: qty 1000

## 2013-10-19 MED ORDER — ROCURONIUM BROMIDE 100 MG/10ML IV SOLN
INTRAVENOUS | Status: AC
  Filled 2013-10-19: qty 1

## 2013-10-19 MED ORDER — ACETAMINOPHEN 160 MG/5ML PO SOLN: 325.0000 mg | ORAL | Status: DC | PRN

## 2013-10-19 MED ORDER — PROMETHAZINE HCL 25 MG/ML IJ SOLN: 6.2500 mg | INTRAMUSCULAR | Status: DC | PRN

## 2013-10-19 MED ORDER — HEPARIN SODIUM (PORCINE) 5000 UNIT/ML IJ SOLN
5000.0000 [IU] | INTRAMUSCULAR | Status: AC
  Administered 2013-10-19: 5000 [IU] via SUBCUTANEOUS
  Filled 2013-10-19: qty 1

## 2013-10-19 MED ORDER — LACTATED RINGERS IV SOLN
INTRAVENOUS | Status: DC | PRN
  Administered 2013-10-19 (×4): via INTRAVENOUS

## 2013-10-19 MED ORDER — ONDANSETRON HCL 4 MG/2ML IJ SOLN
INTRAMUSCULAR | Status: DC | PRN
  Administered 2013-10-19: 4 mg via INTRAVENOUS

## 2013-10-19 MED ORDER — BUPIVACAINE LIPOSOME 1.3 % IJ SUSP
20.0000 mL | Freq: Once | INTRAMUSCULAR | Status: DC
  Filled 2013-10-19: qty 20

## 2013-10-19 MED ORDER — TISSEEL VH 10 ML EX KIT
PACK | CUTANEOUS | Status: AC
  Filled 2013-10-19: qty 2

## 2013-10-19 MED ORDER — ONDANSETRON HCL 4 MG/2ML IJ SOLN
INTRAMUSCULAR | Status: AC
  Filled 2013-10-19: qty 2

## 2013-10-19 MED ORDER — FENTANYL CITRATE 0.05 MG/ML IJ SOLN
INTRAMUSCULAR | Status: DC | PRN
  Administered 2013-10-19 (×6): 50 ug via INTRAVENOUS
  Administered 2013-10-19: 100 ug via INTRAVENOUS
  Administered 2013-10-19: 50 ug via INTRAVENOUS

## 2013-10-19 MED ORDER — GLYCOPYRROLATE 0.2 MG/ML IJ SOLN
INTRAMUSCULAR | Status: AC
  Filled 2013-10-19: qty 3

## 2013-10-19 MED ORDER — UNJURY CHICKEN SOUP POWDER
2.0000 [oz_av] | Freq: Four times a day (QID) | ORAL | Status: DC
  Filled 2013-10-19 (×18): qty 27

## 2013-10-19 MED ORDER — MORPHINE SULFATE 2 MG/ML IJ SOLN
2.0000 mg | INTRAMUSCULAR | Status: DC | PRN
  Administered 2013-10-19 – 2013-10-20 (×5): 4 mg via INTRAVENOUS
  Administered 2013-10-20 – 2013-10-24 (×2): 2 mg via INTRAVENOUS
  Administered 2013-10-24: 4 mg via INTRAVENOUS
  Administered 2013-10-24 (×2): 6 mg via INTRAVENOUS
  Administered 2013-10-24: 2 mg via INTRAVENOUS
  Administered 2013-10-24 (×2): 6 mg via INTRAVENOUS
  Administered 2013-10-24: 4 mg via INTRAVENOUS
  Administered 2013-10-25: 6 mg via INTRAVENOUS
  Administered 2013-10-25: 4 mg via INTRAVENOUS
  Administered 2013-10-25: 2 mg via INTRAVENOUS
  Administered 2013-10-25 (×4): 6 mg via INTRAVENOUS
  Administered 2013-10-26 (×2): 4 mg via INTRAVENOUS
  Administered 2013-10-26 (×2): 6 mg via INTRAVENOUS
  Administered 2013-10-26: 4 mg via INTRAVENOUS
  Administered 2013-10-26: 6 mg via INTRAVENOUS
  Administered 2013-10-26: 4 mg via INTRAVENOUS
  Administered 2013-10-26: 6 mg via INTRAVENOUS
  Administered 2013-10-27: 2 mg via INTRAVENOUS
  Administered 2013-10-27 (×2): 6 mg via INTRAVENOUS
  Administered 2013-10-27: 4 mg via INTRAVENOUS
  Administered 2013-10-27: 6 mg via INTRAVENOUS
  Administered 2013-10-27: 2 mg via INTRAVENOUS
  Administered 2013-10-28 (×9): 4 mg via INTRAVENOUS
  Administered 2013-10-28: 2 mg via INTRAVENOUS
  Administered 2013-10-29: 4 mg via INTRAVENOUS
  Administered 2013-10-30 – 2013-10-31 (×3): 2 mg via INTRAVENOUS
  Administered 2013-11-01 – 2013-11-03 (×2): 4 mg via INTRAVENOUS
  Administered 2013-11-07 (×2): 2 mg via INTRAVENOUS
  Filled 2013-10-19 (×2): qty 3
  Filled 2013-10-19 (×2): qty 2
  Filled 2013-10-19 (×2): qty 3
  Filled 2013-10-19: qty 1
  Filled 2013-10-19: qty 2
  Filled 2013-10-19 (×3): qty 3
  Filled 2013-10-19: qty 2
  Filled 2013-10-19: qty 3
  Filled 2013-10-19 (×3): qty 1
  Filled 2013-10-19: qty 2
  Filled 2013-10-19: qty 1
  Filled 2013-10-19: qty 3
  Filled 2013-10-19 (×10): qty 2
  Filled 2013-10-19: qty 1
  Filled 2013-10-19 (×2): qty 2
  Filled 2013-10-19: qty 1
  Filled 2013-10-19: qty 2
  Filled 2013-10-19 (×3): qty 3
  Filled 2013-10-19: qty 2
  Filled 2013-10-19: qty 1
  Filled 2013-10-19 (×3): qty 2
  Filled 2013-10-19: qty 3
  Filled 2013-10-19 (×2): qty 1
  Filled 2013-10-19: qty 2
  Filled 2013-10-19: qty 1
  Filled 2013-10-19 (×2): qty 3
  Filled 2013-10-19: qty 2
  Filled 2013-10-19: qty 3
  Filled 2013-10-19: qty 2
  Filled 2013-10-19: qty 1

## 2013-10-19 MED ORDER — INSULIN ASPART 100 UNIT/ML ~~LOC~~ SOLN
0.0000 [IU] | SUBCUTANEOUS | Status: DC
  Administered 2013-10-19 – 2013-10-20 (×2): 7 [IU] via SUBCUTANEOUS
  Administered 2013-10-20 – 2013-10-22 (×9): 3 [IU] via SUBCUTANEOUS
  Administered 2013-10-22: 4 [IU] via SUBCUTANEOUS
  Administered 2013-10-22: 3 [IU] via SUBCUTANEOUS
  Administered 2013-10-22: 4 [IU] via SUBCUTANEOUS
  Administered 2013-10-23 – 2013-10-31 (×16): 3 [IU] via SUBCUTANEOUS

## 2013-10-19 MED ORDER — LIDOCAINE HCL (CARDIAC) 20 MG/ML IV SOLN
INTRAVENOUS | Status: DC | PRN
  Administered 2013-10-19: 100 mg via INTRAVENOUS

## 2013-10-19 MED ORDER — ROCURONIUM BROMIDE 100 MG/10ML IV SOLN
INTRAVENOUS | Status: DC | PRN
  Administered 2013-10-19 (×2): 10 mg via INTRAVENOUS
  Administered 2013-10-19: 40 mg via INTRAVENOUS
  Administered 2013-10-19 (×5): 10 mg via INTRAVENOUS

## 2013-10-19 MED ORDER — UNJURY VANILLA POWDER
2.0000 [oz_av] | Freq: Four times a day (QID) | ORAL | Status: DC
  Administered 2013-10-21: 2 [oz_av] via ORAL
  Filled 2013-10-19 (×18): qty 27

## 2013-10-19 MED ORDER — EPHEDRINE SULFATE 50 MG/ML IJ SOLN
INTRAMUSCULAR | Status: AC
  Filled 2013-10-19: qty 1

## 2013-10-19 MED ORDER — GLYCOPYRROLATE 0.2 MG/ML IJ SOLN
INTRAMUSCULAR | Status: DC | PRN
  Administered 2013-10-19: 0.6 mg via INTRAVENOUS

## 2013-10-19 MED ORDER — HEPARIN SODIUM (PORCINE) 5000 UNIT/ML IJ SOLN
5000.0000 [IU] | Freq: Three times a day (TID) | INTRAMUSCULAR | Status: DC
  Administered 2013-10-19 – 2013-11-09 (×60): 5000 [IU] via SUBCUTANEOUS
  Filled 2013-10-19 (×65): qty 1

## 2013-10-19 MED ORDER — SODIUM CHLORIDE 0.9 % IJ SOLN
INTRAMUSCULAR | Status: AC
  Filled 2013-10-19: qty 10

## 2013-10-19 MED ORDER — LACTATED RINGERS IR SOLN
Status: DC | PRN
  Administered 2013-10-19: 3000 mL

## 2013-10-19 MED ORDER — DEXAMETHASONE SODIUM PHOSPHATE 10 MG/ML IJ SOLN
INTRAMUSCULAR | Status: DC | PRN
  Administered 2013-10-19: 10 mg via INTRAVENOUS

## 2013-10-19 MED ORDER — ACETAMINOPHEN 10 MG/ML IV SOLN
1000.0000 mg | Freq: Once | INTRAVENOUS | Status: AC
  Administered 2013-10-19: 1000 mg via INTRAVENOUS
  Filled 2013-10-19: qty 100

## 2013-10-19 MED ORDER — FENTANYL CITRATE 0.05 MG/ML IJ SOLN
25.0000 ug | INTRAMUSCULAR | Status: DC | PRN
  Administered 2013-10-19 (×2): 50 ug via INTRAVENOUS

## 2013-10-19 SURGICAL SUPPLY — 68 items
APPLICATOR COTTON TIP 6IN STRL (MISCELLANEOUS) ×3 IMPLANT
APPLIER CLIP ROT 10 11.4 M/L (STAPLE) ×3
BENZOIN TINCTURE PRP APPL 2/3 (GAUZE/BANDAGES/DRESSINGS) IMPLANT
BLADE SURG 15 STRL LF DISP TIS (BLADE) ×1 IMPLANT
BLADE SURG 15 STRL SS (BLADE) ×2
CABLE HIGH FREQUENCY MONO STRZ (ELECTRODE) ×3 IMPLANT
CANISTER SUCTION 2500CC (MISCELLANEOUS) ×3 IMPLANT
CLIP APPLIE ROT 10 11.4 M/L (STAPLE) ×1 IMPLANT
CLIP SUT LAPRA TY ABSORB (SUTURE) ×3 IMPLANT
CLOSURE WOUND 1/2 X4 (GAUZE/BANDAGES/DRESSINGS)
DEVICE SUTURE ENDOST 10MM (ENDOMECHANICALS) ×3 IMPLANT
DISSECTOR BLUNT TIP ENDO 5MM (MISCELLANEOUS) ×3 IMPLANT
DRAIN PENROSE 18X1/2 LTX STRL (DRAIN) ×3 IMPLANT
DRAIN PENROSE 18X1/4 LTX STRL (WOUND CARE) ×3 IMPLANT
DRAPE CAMERA CLOSED 9X96 (DRAPES) ×3 IMPLANT
EVACUATOR SILICONE 100CC (DRAIN) ×3 IMPLANT
FLOSHIELD STORZ 10MM/45 (MISCELLANEOUS) ×3 IMPLANT
GAUZE SPONGE 4X4 16PLY XRAY LF (GAUZE/BANDAGES/DRESSINGS) ×3 IMPLANT
GLOVE BIOGEL M 8.0 STRL (GLOVE) ×3 IMPLANT
GOWN SPEC L4 XLG W/TWL (GOWN DISPOSABLE) ×9 IMPLANT
HANDLE STAPLE EGIA 4 XL (STAPLE) ×3 IMPLANT
HOVERMATT SINGLE USE (MISCELLANEOUS) ×3 IMPLANT
KIT BASIN OR (CUSTOM PROCEDURE TRAY) ×3 IMPLANT
KIT GASTRIC LAVAGE 34FR ADT (SET/KITS/TRAYS/PACK) ×3 IMPLANT
MARKER SKIN DUAL TIP RULER LAB (MISCELLANEOUS) ×3 IMPLANT
NEEDLE SPNL 22GX3.5 QUINCKE BK (NEEDLE) ×3 IMPLANT
NS IRRIG 1000ML POUR BTL (IV SOLUTION) ×3 IMPLANT
PACK CARDIOVASCULAR III (CUSTOM PROCEDURE TRAY) ×3 IMPLANT
RELOAD EGIA 45 MED/THCK PURPLE (STAPLE) IMPLANT
RELOAD EGIA 45 TAN VASC (STAPLE) ×6 IMPLANT
RELOAD EGIA 60 MED/THCK PURPLE (STAPLE) ×6 IMPLANT
RELOAD EGIA 60 TAN VASC (STAPLE) ×3 IMPLANT
RELOAD ENDO STITCH 2.0 (ENDOMECHANICALS) ×18
RELOAD TRI 60 ART MED THCK PUR (STAPLE) ×9 IMPLANT
SCISSORS LAP 5X45 EPIX DISP (ENDOMECHANICALS) ×3 IMPLANT
SEALANT SURGICAL APPL DUAL CAN (MISCELLANEOUS) ×3 IMPLANT
SET IRRIG TUBING LAPAROSCOPIC (IRRIGATION / IRRIGATOR) ×3 IMPLANT
SHEARS CURVED HARMONIC AC 45CM (MISCELLANEOUS) ×6 IMPLANT
SLEEVE ADV FIXATION 12X100MM (TROCAR) ×6 IMPLANT
SLEEVE ADV FIXATION 5X100MM (TROCAR) ×6 IMPLANT
SOLUTION ANTI FOG 6CC (MISCELLANEOUS) ×3 IMPLANT
SPONGE GAUZE 4X4 12PLY (GAUZE/BANDAGES/DRESSINGS) ×3 IMPLANT
STAPLER VISISTAT 35W (STAPLE) ×3 IMPLANT
STRIP CLOSURE SKIN 1/2X4 (GAUZE/BANDAGES/DRESSINGS) IMPLANT
STRIP PERI DRY VERITAS 45 (STAPLE) IMPLANT
STRIP PERI DRY VERITAS 60 (STAPLE) ×3 IMPLANT
SUT ETHILON 3 0 PS 1 (SUTURE) ×3 IMPLANT
SUT RELOAD ENDO STITCH 2 48X1 (ENDOMECHANICALS) ×5
SUT RELOAD ENDO STITCH 2.0 (ENDOMECHANICALS) ×4
SUT VIC AB 2-0 SH 27 (SUTURE) ×3
SUT VIC AB 2-0 SH 27X BRD (SUTURE) ×1 IMPLANT
SUT VIC AB 4-0 SH 18 (SUTURE) ×3 IMPLANT
SUTURE RELOAD END STTCH 2 48X1 (ENDOMECHANICALS) ×5 IMPLANT
SUTURE RELOAD ENDO STITCH 2.0 (ENDOMECHANICALS) ×4 IMPLANT
SYR 20CC LL (SYRINGE) ×3 IMPLANT
SYR 30ML LL (SYRINGE) ×3 IMPLANT
SYR 50ML LL SCALE MARK (SYRINGE) ×3 IMPLANT
TOWEL OR 17X26 10 PK STRL BLUE (TOWEL DISPOSABLE) ×6 IMPLANT
TOWEL OR NON WOVEN STRL DISP B (DISPOSABLE) ×3 IMPLANT
TRAY FOLEY CATH 14FRSI W/METER (CATHETERS) ×3 IMPLANT
TROCAR ADV FIXATION 12X100MM (TROCAR) ×3 IMPLANT
TROCAR ADV FIXATION 5X100MM (TROCAR) ×3 IMPLANT
TROCAR BLADELESS OPT 5 100 (ENDOMECHANICALS) ×3 IMPLANT
TROCAR XCEL 12X100 BLDLESS (ENDOMECHANICALS) ×3 IMPLANT
TUBING CONNECTING 10 (TUBING) ×2 IMPLANT
TUBING CONNECTING 10' (TUBING) ×1
TUBING ENDO SMARTCAP PENTAX (MISCELLANEOUS) ×3 IMPLANT
TUBING FILTER THERMOFLATOR (ELECTROSURGICAL) ×3 IMPLANT

## 2013-10-19 NOTE — Progress Notes (Signed)
Brief Rx note:  Morphine allergy  Dr Hassell Done aware of allergy- wants to proceed d/t pt being allergic to multiple meds.  Pt states "itching" as rxn. Spoke with RN-if they give, they will watch closely and go from there  Dorrene German 10/19/2013 3:21 PM

## 2013-10-19 NOTE — Op Note (Signed)
Surgeon: Kaylyn Lim, MD, FACS  Asst:  Adonis Housekeeper, MD, FACS  Anes:  general  Procedure: Laparoscopic removal of lapband, laparoscopic roux en Y gastric bypass, upper endoscopy  Diagnosis: Prior lap distal pancreatectomy and 10 cm lapband;  Adhesions between the pancreatic stump and the posterior stomach complicated the gastric pouch creation  Complications: none  EBL:   30 cc  Description of Procedure:  The patient had a lapband in 2005 and a lap distal pancreatectomy a couple of years ago leaving her with diabetes which is more difficult to manage with the excess weight if she had kept and had been unable to lose weight her lap band. We discussed options preoperatively and would try a lap gastric bypass with a backup plan of a sleeve depending on how bad adhesions were.  Following induction she was given prepped with PCMX and draped sterilely. Total anesthesia time was 5 hours total operative time was 4 and half hours. After timeout we began the operation. The operation could be considered in sequences including #1 a laparoscopic removal of the 10 cm lap band. #2 creation of the gastric pouch which required about an hour and a half because the pancreas was stuck to the posterior gastric wall and we had to create a space for [place] to create the gastric pouch #3 creation of the jejunojejunostomy #4 division of the omentum and creation of the gastrojejunostomy or 5 upper endoscopy by Dr. Excell Seltzer to assess the pouch and checked for leaks #6 removal of the lap band device through a 15 cm port and placement of a JP drain in the upper abdomen.    Abdomen was entered to the left upper quadrant with a 5 mm Optiview technique and 12 middle meter technology was used because the using FloSeal device and a 12 mm angle scope. Once her many of her previous old laparoscopy holes. The Nathanson retractor was placed to retract the liver area using a scissor dissector I went ahead and took down the lap band and  adhesions to the stomach. Once this was freed went ahead and divide the plication again using scissors and I got the lap band unsnapped and ongoing wound and then removed. Remaining portion the plication was taken down.  Next we worked down measured about 65 cm along the lesser curvature of the stomach and began trying to get into the retrogastric area the first firing of the stapler. We worked diligently but were unable to create a dissection as the stomach seemed to be fused. As it turned out this is where the stomach was fixed to the staple line from the pancreatectomy. We ended up going through the retrogastric space through the lesser sac along the greater curvature of the stomach getting in and then working back indirect regrade fashion. The past Penrose drain through the little opening we made Reglan and increased size the dissection and thereby see the staple line the pancreas in a carefully taken down from the back wall the stomach using scissors and the harmonic scalpel. When this was completed the stomach was seen free in the pancreas was intact. There was no bleeding. I was able then to begin creation of the pouch bringing the stapler in an upper angle but angulating it to get across and create a first firing using a purple load and then completing it with a second 6 mm Covidien purple load and the remainder of the 6 mm purple loads had the TRS system buttressed material on them. The pouch  was completed and bleeding and urinary control.  X-ray went identified the ligament of Treitz and measured 40 cm downstream where we divided the bowel but a Penrose on the Roux limb end. We then measured 100 cm and created a side-to-side jejunojejunostomy with a 4.5 mm stapler closing the common defect with from either end with running 2-0 Vicryl. Mesenteric defect was closed with running 2-0 silk and Tisseel was applied. The omentum was divided with the harmonic scalpel. We brought the Roux limb up sutured back  row with running 2-0 Vicryl. We opened on the right side and pass a 4.5 mm stapler firing this and completing the gastrojejunostomy. The common defect was closed later in with 2-0 Vicryl was then a second layer of 2-0 Vicryl was used to create a second layer anteriorly.  Dr. Excell Seltzer is endoscoped the patient revealing the pouch to be about 5-6-7 cm long and no bleeding was noted. She did have some gastric polyps which were noted. We may consider re\re endoscope in her in the distant future.  The previous lap band port was removed. The the device itself actually been removed placed a 15 mm trocar earlier. Port sites were injected with Exparel. A 19 mm Keenan Bachelor was brought in placed in the upper abdomen and brought to the left lateral port. It was secured with a 3-0 nylon.  Port sites were inspected as was the gastric jejunojejunostomy. They appear to be in order. We then closed and deflated the abdomen closing the skin with 4-0 Vicryl and Dermabond.  Matt B. Hassell Done, [place], [facility], [place]

## 2013-10-19 NOTE — Anesthesia Preprocedure Evaluation (Addendum)
Anesthesia Evaluation  Patient identified by MRN, date of birth, ID band Patient awake    Reviewed: Allergy & Precautions, H&P , NPO status , Patient's Chart, lab work & pertinent test results  Airway Mallampati: II TM Distance: >3 FB Neck ROM: Full    Dental no notable dental hx.    Pulmonary former smoker,  breath sounds clear to auscultation  Pulmonary exam normal       Cardiovascular hypertension, Pt. on medications Rhythm:Regular Rate:Normal     Neuro/Psych negative neurological ROS  negative psych ROS   GI/Hepatic negative GI ROS, Neg liver ROS,   Endo/Other  diabetes, Type 2, Oral Hypoglycemic AgentsMorbid obesity  Renal/GU negative Renal ROS  negative genitourinary   Musculoskeletal negative musculoskeletal ROS (+)   Abdominal   Peds negative pediatric ROS (+)  Hematology negative hematology ROS (+)   Anesthesia Other Findings   Reproductive/Obstetrics negative OB ROS                          Anesthesia Physical Anesthesia Plan  ASA: III  Anesthesia Plan: General   Post-op Pain Management:    Induction: Intravenous  Airway Management Planned: Oral ETT  Additional Equipment:   Intra-op Plan:   Post-operative Plan: Extubation in OR  Informed Consent: I have reviewed the patients History and Physical, chart, labs and discussed the procedure including the risks, benefits and alternatives for the proposed anesthesia with the patient or authorized representative who has indicated his/her understanding and acceptance.   Dental advisory given  Plan Discussed with: CRNA  Anesthesia Plan Comments:         Anesthesia Quick Evaluation

## 2013-10-19 NOTE — Transfer of Care (Signed)
Immediate Anesthesia Transfer of Care Note  Patient: Carol Jackson  Procedure(s) Performed: Procedure(s) (LRB): LAPAROSCOPIC ROUX-EN-Y GASTRIC BYPASS WITH UPPER ENDOSCOPY AND REMOVAL OF LAP BAND (N/A)  Patient Location: PACU  Anesthesia Type: General  Level of Consciousness: sedated, patient cooperative and responds to stimulation  Airway & Oxygen Therapy: Patient Spontanous Breathing and Patient connected to face mask oxgen  Post-op Assessment: Report given to PACU RN and Post -op Vital signs reviewed and stable  Post vital signs: Reviewed and stable  Complications: No apparent anesthesia complications

## 2013-10-19 NOTE — H&P (View-Only) (Signed)
Chief Complaint:  Obesity, DM, and failed lapband  History of Present Illness:  Carol Jackson is an 68 y.o. female who underwent Lapband surgery in 2005 and then the long-term has failed. Current weight is 249 despite 6 months supervised weight loss. 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 Subsequently she developed an empyema on the right side and underwent a decortication to wait for as well.  She's researched this l on the Internet about that conversion and wants to go and have her band removed and converted to Roux-en-Y gastric bypass. I explained this is a more complicated and he does have risks but she feels this is necessary and I agree.  Past Medical History  Diagnosis Date  . Diabetes mellitus without complication   . GERD (gastroesophageal reflux disease)   . Hyperlipidemia   . Hypertension   . Thyroid disease     hypothyroidism  . Morbid obesity     Past Surgical History  Procedure Laterality Date  . Laparoscopic gastric banding  04/03/04  . Abdominal hysterectomy  01/15/00  . Bladder suspension  11/08/08  . 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 Pancreas surgery  09/24/11    tail end removed  . Lung surgery  05/06/12    Current Outpatient Prescriptions  Medication Sig Dispense Refill  . amLODipine (NORVASC) 10 MG tablet Take 10 mg by mouth daily.      Marland Kitchen aspirin 81 MG tablet Take 81 mg by mouth daily.      . Cholecalciferol (VITAMIN D-3) 1000 UNITS CAPS Take by mouth.      . Cyanocobalamin (VITAMIN B-12) 2500 MCG SUBL Place under the tongue.      Marland Kitchen Fe Bisgly-Succ-C-Thre-B12-FA (IRON 21/7 PO) Take 27 mg by mouth daily.       Marland Kitchen glucose blood test strip 1 each by Other route as needed for other. Use as instructed      . Krill Oil Omega-3 300 MG CAPS Take by mouth.      . Lancets MISC by Does not apply route.      Marland Kitchen levothyroxine (SYNTHROID, LEVOTHROID) 50 MCG  tablet Take 50 mcg by mouth daily before breakfast.      . loratadine (CLARITIN) 10 MG tablet Take 10 mg by mouth daily.      . metFORMIN (GLUCOPHAGE) 500 MG tablet Take 500 mg by mouth 2 (two) times daily with a meal.      . metoCLOPramide (REGLAN) 10 MG tablet Take 10 mg by mouth 4 (four) times daily.      Marland Kitchen omeprazole (PRILOSEC) 20 MG capsule Take 20 mg by mouth daily.      . potassium chloride (KLOR-CON) 20 MEQ packet Take 20 mEq by mouth 2 (two) times daily.      . simvastatin (ZOCOR) 40 MG tablet Take 40 mg by mouth every evening.       No current facility-administered medications for this visit.   Morphine and related and Levaquin Family History  Problem Relation Age of Onset  . Heart disease Father    Social History:   reports that she quit smoking about 29 years ago. Her smoking use included Cigarettes. She smoked 0.00 packs per day. She does not have any smokeless tobacco history on file. She reports that she drinks alcohol. She reports that she does not use illicit drugs.   REVIEW OF SYSTEMS - PERTINENT POSITIVES ONLY: Negative for DVT  Physical  Exam:   Blood pressure 136/100, pulse 88, temperature 97.8 F (36.6 C), height 5\' 6"  (1.676 m), weight 249 lb (112.946 kg). Body mass index is 40.21 kg/(m^2).  Gen:  WDWN WF NAD  Neurological: Alert and oriented to person, place, and time. Motor and sensory function is grossly intact  Head: Normocephalic and atraumatic.  Eyes: Conjunctivae are normal. Pupils are equal, round, and reactive to light. No scleral icterus.  Neck: Normal range of motion. Neck supple. No tracheal deviation or thyromegaly present.  Cardiovascular:  SR without murmurs or gallops.  No carotid bruits Respiratory: Effort normal.  No respiratory distress. No chest wall tenderness. Breath sounds normal.  No wheezes, rales or rhonchi.  Abdomen:  nontender with multiple laparoscopic scars.   GU: Musculoskeletal: Normal range of motion. Extremities are nontender.  No cyanosis, edema or clubbing noted Lymphadenopathy: No cervical, preauricular, postauricular or axillary adenopathy is present Skin: Skin is warm and dry. No rash noted. No diaphoresis. No erythema. No pallor. Pscyh: Normal mood and affect. Behavior is normal. Judgment and thought content normal.   LABORATORY RESULTS: No results found for this or any previous visit (from the past 48 hour(s)).  RADIOLOGY RESULTS: No results found.  Problem List: Patient Active Problem List   Diagnosis Date Noted  . Diabetes 09/29/2013  . Thoracotomy scar of right chest-decortication at [facility] 09/29/2013  . Post-pancreatectomy diabetes-splenectomy at [facility] 09/29/2013  . 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 Obesity, unspecified 10/22/2012    Assessment & Plan: Failed 10 cm lapband desiring conversion to lap roux en y gastric bypass.  Anticipate scarring in upper abdomen from splenectomy but will move forward with conversion to roux Y    Matt B. Hassell Done, MD, [facility] [phone] beeper [phone]  09/29/2013 2:46 PM

## 2013-10-19 NOTE — Interval H&P Note (Signed)
History and Physical Interval Note:  10/19/2013 7:26 AM  Carol Jackson  has presented today for surgery, with the diagnosis of morbid obesity   The various methods of treatment have been discussed with the patient and family. After consideration of risks, benefits and other options for treatment, the patient has consented to  Procedure(s): LAPAROSCOPIC ROUX-EN-Y GASTRIC GASTRIC BYPASS WITH UPPER ENDOSCOPY AND REMOVAL OF LAP BAND (N/A) and possible sleeve gastrectomy as a surgical intervention .  The patient's history has been reviewed, patient examined, no change in status, stable for surgery.  I have reviewed the patient's chart and labs.  Questions were answered to the patient's satisfaction.  She is aware that the sleeve gastrectomy may be an option if adhesions prevent roux Y.     Pedro Earls

## 2013-10-19 NOTE — Progress Notes (Signed)
Utilization review completed.  

## 2013-10-19 NOTE — Anesthesia Postprocedure Evaluation (Signed)
  Anesthesia Post-op Note  Patient: Carol Jackson  Procedure(s) Performed: Procedure(s) (LRB): LAPAROSCOPIC ROUX-EN-Y GASTRIC BYPASS WITH UPPER ENDOSCOPY AND REMOVAL OF LAP BAND (N/A)  Patient Location: PACU  Anesthesia Type: General  Level of Consciousness: awake and alert   Airway and Oxygen Therapy: Patient Spontanous Breathing  Post-op Pain: mild  Post-op Assessment: Post-op Vital signs reviewed, Patient's Cardiovascular Status Stable, Respiratory Function Stable, Patent Airway and No signs of Nausea or vomiting  Last Vitals:  Filed Vitals:   10/19/13 1415  BP:   Pulse:   Temp: 36.3 C  Resp:     Post-op Vital Signs: stable   Complications: No apparent anesthesia complications

## 2013-10-20 ENCOUNTER — Encounter (HOSPITAL_COMMUNITY): Payer: Self-pay

## 2013-10-20 ENCOUNTER — Inpatient Hospital Stay (HOSPITAL_COMMUNITY): Payer: Medicare Other

## 2013-10-20 ENCOUNTER — Telehealth (INDEPENDENT_AMBULATORY_CARE_PROVIDER_SITE_OTHER): Payer: Self-pay

## 2013-10-20 DIAGNOSIS — Z09 Encounter for follow-up examination after completed treatment for conditions other than malignant neoplasm: Secondary | ICD-10-CM

## 2013-10-20 LAB — CBC WITH DIFFERENTIAL/PLATELET
Basophils Absolute: 0 10*3/uL (ref 0.0–0.1)
Basophils Relative: 0 % (ref 0–1)
EOS PCT: 0 % (ref 0–5)
Eosinophils Absolute: 0 10*3/uL (ref 0.0–0.7)
HCT: 41.6 % (ref 36.0–46.0)
HEMOGLOBIN: 14.4 g/dL (ref 12.0–15.0)
LYMPHS ABS: 1.8 10*3/uL (ref 0.7–4.0)
Lymphocytes Relative: 10 % — ABNORMAL LOW (ref 12–46)
MCH: 29 pg (ref 26.0–34.0)
MCHC: 34.6 g/dL (ref 30.0–36.0)
MCV: 83.7 fL (ref 78.0–100.0)
MONOS PCT: 9 % (ref 3–12)
Monocytes Absolute: 1.5 10*3/uL — ABNORMAL HIGH (ref 0.1–1.0)
Neutro Abs: 14.1 10*3/uL — ABNORMAL HIGH (ref 1.7–7.7)
Neutrophils Relative %: 81 % — ABNORMAL HIGH (ref 43–77)
PLATELETS: 292 10*3/uL (ref 150–400)
RBC: 4.97 MIL/uL (ref 3.87–5.11)
RDW: 15.5 % (ref 11.5–15.5)
WBC: 17.3 10*3/uL — AB (ref 4.0–10.5)

## 2013-10-20 LAB — GLUCOSE, CAPILLARY
GLUCOSE-CAPILLARY: 175 mg/dL — AB (ref 70–99)
GLUCOSE-CAPILLARY: 213 mg/dL — AB (ref 70–99)
GLUCOSE-CAPILLARY: 150 mg/dL — AB (ref 70–99)
GLUCOSE-CAPILLARY: 122 mg/dL — AB (ref 70–99)
GLUCOSE-CAPILLARY: 87 mg/dL (ref 70–99)
Glucose-Capillary: 224 mg/dL — ABNORMAL HIGH (ref 70–99)
Glucose-Capillary: 141 mg/dL — ABNORMAL HIGH (ref 70–99)
Glucose-Capillary: 98 mg/dL (ref 70–99)

## 2013-10-20 LAB — COMPREHENSIVE METABOLIC PANEL
ALT: 213 U/L — ABNORMAL HIGH (ref 0–35)
AST: 178 U/L — ABNORMAL HIGH (ref 0–37)
Albumin: 3.2 g/dL — ABNORMAL LOW (ref 3.5–5.2)
Alkaline Phosphatase: 45 U/L (ref 39–117)
BUN: 10 mg/dL (ref 6–23)
CO2: 25 meq/L (ref 19–32)
Calcium: 8.7 mg/dL (ref 8.4–10.5)
Chloride: 102 mEq/L (ref 96–112)
Creatinine, Ser: 0.57 mg/dL (ref 0.50–1.10)
GFR calc non Af Amer: >90 mL/min (ref >90)
GLUCOSE: 148 mg/dL — AB (ref 70–99)
POTASSIUM: 3.8 meq/L (ref 3.7–5.3)
Sodium: 139 mEq/L (ref 137–147)
TOTAL PROTEIN: 6.5 g/dL (ref 6.0–8.3)
Total Bilirubin: 0.3 mg/dL (ref 0.3–1.2)

## 2013-10-20 LAB — HEMOGLOBIN AND HEMATOCRIT, BLOOD
HCT: 40.6 % (ref 36.0–46.0)
HEMOGLOBIN: 13.8 g/dL (ref 12.0–15.0)

## 2013-10-20 LAB — LIPASE, BLOOD: Lipase: 19 U/L (ref 11–59)

## 2013-10-20 MED ORDER — IOHEXOL 300 MG/ML  SOLN
50.0000 mL | Freq: Once | INTRAMUSCULAR | Status: AC | PRN
  Administered 2013-10-20: 35 mL via ORAL

## 2013-10-20 NOTE — Progress Notes (Signed)
Patient ID: Carol Jackson, female   DOB: Dec 02, 1945, 68 y.o.   MRN: [id number] [facility] Progress Note:   1 Day Post-Op  Subjective: Mental status is clear Objective: Vital signs in last 24 hours: Temp:  [97.1 F (36.2 C)-98.3 F (36.8 C)] 98.3 F (36.8 C) (06/03 0800) Pulse Rate:  [58-70] 67 (06/03 0800) Resp:  [11-18] 18 (06/03 0800) BP: (129-157)/(59-85) 157/64 mmHg (06/03 0800) SpO2:  [89 %-100 %] 93 % (06/03 0800) Weight:  [253 lb 1.4 oz (114.8 kg)] 253 lb 1.4 oz (114.8 kg) (06/03 0400)  Intake/Output from previous day: 06/02 0701 - 06/03 0700 In: 4050 [I.V.:4050] Out: 2550 [Urine:2080; Drains:370; Blood:100] Intake/Output this shift: Total I/O In: 200 [I.V.:200] Out: -   Physical Exam: Work of breathing is not elevated.  Minimal pain.  JP appropriate  Lab Results:  Results for orders placed during the hospital encounter of 10/19/13 (from the past 48 hour(s))  GLUCOSE, CAPILLARY     Status: Abnormal   Collection Time    10/19/13  5:51 AM      Result Value Ref Range   Glucose-Capillary 128 (*) 70 - 99 mg/dL   Comment 1 Documented in Chart    GLUCOSE, CAPILLARY     Status: Abnormal   Collection Time    10/19/13 10:01 AM      Result Value Ref Range   Glucose-Capillary 221 (*) 70 - 99 mg/dL  GLUCOSE, CAPILLARY     Status: Abnormal   Collection Time    10/19/13 11:02 AM      Result Value Ref Range   Glucose-Capillary 199 (*) 70 - 99 mg/dL  GLUCOSE, CAPILLARY     Status: Abnormal   Collection Time    10/19/13 12:02 PM      Result Value Ref Range   Glucose-Capillary 161 (*) 70 - 99 mg/dL  GLUCOSE, CAPILLARY     Status: Abnormal   Collection Time    10/19/13  1:28 PM      Result Value Ref Range   Glucose-Capillary 175 (*) 70 - 99 mg/dL   Comment 1 Notify RN     Comment 2 Documented in Chart    CBC     Status: Abnormal   Collection Time    10/19/13  1:42 PM      Result Value Ref Range   WBC 25.3 (*) 4.0 - 10.5 K/uL   RBC 5.30 (*) 3.87 -  5.11 MIL/uL   Hemoglobin 15.4 (*) 12.0 - 15.0 g/dL   HCT 44.5  36.0 - 46.0 %   MCV 84.0  78.0 - 100.0 fL   MCH 29.1  26.0 - 34.0 pg   MCHC 34.6  30.0 - 36.0 g/dL   RDW 15.4  11.5 - 15.5 %   Platelets 295  150 - 400 K/uL  CREATININE, SERUM     Status: None   Collection Time    10/19/13  1:42 PM      Result Value Ref Range   Creatinine, Ser 0.61  0.50 - 1.10 mg/dL   GFR calc non Af Amer >90  >90 mL/min   GFR calc Af Amer >90  >90 mL/min   Comment: (NOTE)     The eGFR has been calculated using the CKD EPI equation.     This calculation has not been validated in all clinical situations.     eGFR's persistently <90 mL/min signify possible Chronic Kidney     Disease.  MRSA PCR SCREENING  Status: None   Collection Time    10/19/13  3:08 PM      Result Value Ref Range   MRSA by PCR NEGATIVE  NEGATIVE   Comment:            The GeneXpert MRSA Assay (FDA     approved for NASAL specimens     only), is one component of a     comprehensive MRSA colonization     surveillance program. It is not     intended to diagnose MRSA     infection nor to guide or     monitor treatment for     MRSA infections.  GLUCOSE, CAPILLARY     Status: Abnormal   Collection Time    10/19/13 10:18 PM      Result Value Ref Range   Glucose-Capillary 224 (*) 70 - 99 mg/dL   Comment 1 Documented in Chart     Comment 2 Notify RN    GLUCOSE, CAPILLARY     Status: Abnormal   Collection Time    10/20/13  1:05 AM      Result Value Ref Range   Glucose-Capillary 213 (*) 70 - 99 mg/dL  CBC WITH DIFFERENTIAL     Status: Abnormal   Collection Time    10/20/13  3:12 AM      Result Value Ref Range   WBC 17.3 (*) 4.0 - 10.5 K/uL   RBC 4.97  3.87 - 5.11 MIL/uL   Hemoglobin 14.4  12.0 - 15.0 g/dL   HCT 41.6  36.0 - 46.0 %   MCV 83.7  78.0 - 100.0 fL   MCH 29.0  26.0 - 34.0 pg   MCHC 34.6  30.0 - 36.0 g/dL   RDW 15.5  11.5 - 15.5 %   Platelets 292  150 - 400 K/uL   Neutrophils Relative % 81 (*) 43 - 77 %    Neutro Abs 14.1 (*) 1.7 - 7.7 K/uL   Lymphocytes Relative 10 (*) 12 - 46 %   Lymphs Abs 1.8  0.7 - 4.0 K/uL   Monocytes Relative 9  3 - 12 %   Monocytes Absolute 1.5 (*) 0.1 - 1.0 K/uL   Eosinophils Relative 0  0 - 5 %   Eosinophils Absolute 0.0  0.0 - 0.7 K/uL   Basophils Relative 0  0 - 1 %   Basophils Absolute 0.0  0.0 - 0.1 K/uL  GLUCOSE, CAPILLARY     Status: Abnormal   Collection Time    10/20/13  4:49 AM      Result Value Ref Range   Glucose-Capillary 150 (*) 70 - 99 mg/dL   Comment 1 Documented in Chart     Comment 2 Notify RN    GLUCOSE, CAPILLARY     Status: Abnormal   Collection Time    10/20/13  8:12 AM      Result Value Ref Range   Glucose-Capillary 141 (*) 70 - 99 mg/dL   Comment 1 Documented in Chart     Comment 2 Notify RN    LIPASE, BLOOD     Status: None   Collection Time    10/20/13  8:19 AM      Result Value Ref Range   Lipase 19  11 - 59 U/L  COMPREHENSIVE METABOLIC PANEL     Status: Abnormal   Collection Time    10/20/13  8:19 AM      Result Value Ref Range   Sodium 139  137 - 147 mEq/L   Potassium 3.8  3.7 - 5.3 mEq/L   Chloride 102  96 - 112 mEq/L   CO2 25  19 - 32 mEq/L   Glucose, Bld 148 (*) 70 - 99 mg/dL   BUN 10  6 - 23 mg/dL   Creatinine, Ser 0.57  0.50 - 1.10 mg/dL   Calcium 8.7  8.4 - 10.5 mg/dL   Total Protein 6.5  6.0 - 8.3 g/dL   Albumin 3.2 (*) 3.5 - 5.2 g/dL   AST 178 (*) 0 - 37 U/L   ALT 213 (*) 0 - 35 U/L   Alkaline Phosphatase 45  39 - 117 U/L   Total Bilirubin 0.3  0.3 - 1.2 mg/dL   GFR calc non Af Amer >90  >90 mL/min   GFR calc Af Amer >90  >90 mL/min   Comment: (NOTE)     The eGFR has been calculated using the CKD EPI equation.     This calculation has not been validated in all clinical situations.     eGFR's persistently <90 mL/min signify possible Chronic Kidney     Disease.    Radiology/Results: No results found.  Anti-infectives: Anti-infectives   Start     Dose/Rate Route Frequency Ordered Stop   10/19/13  0600  cefOXitin (MEFOXIN) 2 g in dextrose 5 % 50 mL IVPB     2 g 100 mL/hr over 30 Minutes Intravenous On call to O.R. 10/19/13 0534 10/19/13 1154      Assessment/Plan: Problem List: Patient Active Problem List   Diagnosis Date Noted  . 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 S/P gastric bypass 10/19/2013  . Diabetes 09/29/2013  . Thoracotomy scar of right chest-decortication at [facility] 09/29/2013  . Post-pancreatectomy diabetes-splenectomy at [facility] 09/29/2013  . 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 Obesity, unspecified 10/22/2012    Doing well thus far.  UGI pending.  Lipase normal.  1 Day Post-Op    LOS: 1 day   Matt B. Hassell Done, MD, [facility] [phone] beeper [phone]  10/20/2013 9:21 AM

## 2013-10-20 NOTE — Progress Notes (Signed)
*  Preliminary Results* Bilateral lower extremity venous duplex completed. Bilateral lower extremities are negative for deep vein thrombosis. There is no evidence of Baker's cyst bilaterally.  10/20/2013  Maudry Mayhew, RVT, RDCS, RDMS

## 2013-10-20 NOTE — Telephone Encounter (Signed)
Vascular lab call report... Pt negative for bilateral leg DVT.

## 2013-10-20 NOTE — Progress Notes (Signed)
Patient alert and oriented, Post op day 1.  Provided support and encouragement.  Encouraged pulmonary toilet, ambulation and small sips of liquids.  All questions answered.  Will continue to monitor. 

## 2013-10-21 ENCOUNTER — Encounter (HOSPITAL_COMMUNITY): Payer: Self-pay | Admitting: Surgery

## 2013-10-21 LAB — GLUCOSE, CAPILLARY
GLUCOSE-CAPILLARY: 112 mg/dL — AB (ref 70–99)
GLUCOSE-CAPILLARY: 133 mg/dL — AB (ref 70–99)
GLUCOSE-CAPILLARY: 135 mg/dL — AB (ref 70–99)
Glucose-Capillary: 100 mg/dL — ABNORMAL HIGH (ref 70–99)
Glucose-Capillary: 128 mg/dL — ABNORMAL HIGH (ref 70–99)
Glucose-Capillary: 129 mg/dL — ABNORMAL HIGH (ref 70–99)
Glucose-Capillary: 133 mg/dL — ABNORMAL HIGH (ref 70–99)

## 2013-10-21 LAB — CBC WITH DIFFERENTIAL/PLATELET
Basophils Absolute: 0 10*3/uL (ref 0.0–0.1)
Basophils Relative: 0 % (ref 0–1)
EOS ABS: 0.1 10*3/uL (ref 0.0–0.7)
Eosinophils Relative: 0 % (ref 0–5)
HCT: 41.3 % (ref 36.0–46.0)
HEMOGLOBIN: 13.8 g/dL (ref 12.0–15.0)
LYMPHS ABS: 4.8 10*3/uL — AB (ref 0.7–4.0)
LYMPHS PCT: 27 % (ref 12–46)
MCH: 28.5 pg (ref 26.0–34.0)
MCHC: 33.4 g/dL (ref 30.0–36.0)
MCV: 85.3 fL (ref 78.0–100.0)
MONOS PCT: 10 % (ref 3–12)
Monocytes Absolute: 1.8 10*3/uL — ABNORMAL HIGH (ref 0.1–1.0)
NEUTROS ABS: 11.2 10*3/uL — AB (ref 1.7–7.7)
NEUTROS PCT: 63 % (ref 43–77)
PLATELETS: 260 10*3/uL (ref 150–400)
RBC: 4.84 MIL/uL (ref 3.87–5.11)
RDW: 15.8 % — ABNORMAL HIGH (ref 11.5–15.5)
WBC: 18 10*3/uL — AB (ref 4.0–10.5)

## 2013-10-21 NOTE — Progress Notes (Signed)
Patient alert and oriented, Post op day 2.  Provided support and encouragement.  Encouraged pulmonary toilet, ambulation and small sips of liquids.  Advancing to protein shake today.  All questions answered.  Will continue to monitor.

## 2013-10-21 NOTE — Progress Notes (Signed)
Patient ID: Carol Jackson, female   DOB: 12-Feb-1946, 68 y.o.   MRN: [id number] [facility] Surgery Progress Note:   2 Days Post-Op  Subjective: Mental status is clear.  No complaints Objective: Vital signs in last 24 hours: Temp:  [97.9 F (36.6 C)-98.7 F (37.1 C)] 98.7 F (37.1 C) (06/04 0800) Pulse Rate:  [58-66] 66 (06/04 0330) Resp:  [13-15] 14 (06/04 0330) BP: (145-158)/(63-77) 158/77 mmHg (06/04 0330) SpO2:  [91 %-96 %] 92 % (06/04 0330) Weight:  [248 lb 14.4 oz (112.9 kg)] 248 lb 14.4 oz (112.9 kg) (06/04 0329)  Intake/Output from previous day: 06/03 0701 - 06/04 0700 In: 2540 [P.O.:240; I.V.:2300] Out: 2785 [Urine:2700; Drains:85] Intake/Output this shift:    Physical Exam: Work of breathing is normal.  No abdominal pain except left shoulder where she may have irritation from her JP  Lab Results:  Results for orders placed during the hospital encounter of 10/19/13 (from the past 48 hour(s))  GLUCOSE, CAPILLARY     Status: Abnormal   Collection Time    10/19/13 10:01 AM      Result Value Ref Range   Glucose-Capillary 221 (*) 70 - 99 mg/dL  GLUCOSE, CAPILLARY     Status: Abnormal   Collection Time    10/19/13 11:02 AM      Result Value Ref Range   Glucose-Capillary 199 (*) 70 - 99 mg/dL  GLUCOSE, CAPILLARY     Status: Abnormal   Collection Time    10/19/13 12:02 PM      Result Value Ref Range   Glucose-Capillary 161 (*) 70 - 99 mg/dL  GLUCOSE, CAPILLARY     Status: Abnormal   Collection Time    10/19/13  1:28 PM      Result Value Ref Range   Glucose-Capillary 175 (*) 70 - 99 mg/dL   Comment 1 Notify RN     Comment 2 Documented in Chart    CBC     Status: Abnormal   Collection Time    10/19/13  1:42 PM      Result Value Ref Range   WBC 25.3 (*) 4.0 - 10.5 K/uL   RBC 5.30 (*) 3.87 - 5.11 MIL/uL   Hemoglobin 15.4 (*) 12.0 - 15.0 g/dL   HCT 44.5  36.0 - 46.0 %   MCV 84.0  78.0 - 100.0 fL   MCH 29.1  26.0 - 34.0 pg   MCHC 34.6  30.0 - 36.0 g/dL    RDW 15.4  11.5 - 15.5 %   Platelets 295  150 - 400 K/uL  CREATININE, SERUM     Status: None   Collection Time    10/19/13  1:42 PM      Result Value Ref Range   Creatinine, Ser 0.61  0.50 - 1.10 mg/dL   GFR calc non Af Amer >90  >90 mL/min   GFR calc Af Amer >90  >90 mL/min   Comment: (NOTE)     The eGFR has been calculated using the CKD EPI equation.     This calculation has not been validated in all clinical situations.     eGFR's persistently <90 mL/min signify possible Chronic Kidney     Disease.  MRSA PCR SCREENING     Status: None   Collection Time    10/19/13  3:08 PM      Result Value Ref Range   MRSA by PCR NEGATIVE  NEGATIVE   Comment:  The GeneXpert MRSA Assay (FDA     approved for NASAL specimens     only), is one component of a     comprehensive MRSA colonization     surveillance program. It is not     intended to diagnose MRSA     infection nor to guide or     monitor treatment for     MRSA infections.  GLUCOSE, CAPILLARY     Status: Abnormal   Collection Time    10/19/13 10:18 PM      Result Value Ref Range   Glucose-Capillary 224 (*) 70 - 99 mg/dL   Comment 1 Documented in Chart     Comment 2 Notify RN    GLUCOSE, CAPILLARY     Status: Abnormal   Collection Time    10/20/13  1:05 AM      Result Value Ref Range   Glucose-Capillary 213 (*) 70 - 99 mg/dL  CBC WITH DIFFERENTIAL     Status: Abnormal   Collection Time    10/20/13  3:12 AM      Result Value Ref Range   WBC 17.3 (*) 4.0 - 10.5 K/uL   RBC 4.97  3.87 - 5.11 MIL/uL   Hemoglobin 14.4  12.0 - 15.0 g/dL   HCT 41.6  36.0 - 46.0 %   MCV 83.7  78.0 - 100.0 fL   MCH 29.0  26.0 - 34.0 pg   MCHC 34.6  30.0 - 36.0 g/dL   RDW 15.5  11.5 - 15.5 %   Platelets 292  150 - 400 K/uL   Neutrophils Relative % 81 (*) 43 - 77 %   Neutro Abs 14.1 (*) 1.7 - 7.7 K/uL   Lymphocytes Relative 10 (*) 12 - 46 %   Lymphs Abs 1.8  0.7 - 4.0 K/uL   Monocytes Relative 9  3 - 12 %   Monocytes Absolute 1.5  (*) 0.1 - 1.0 K/uL   Eosinophils Relative 0  0 - 5 %   Eosinophils Absolute 0.0  0.0 - 0.7 K/uL   Basophils Relative 0  0 - 1 %   Basophils Absolute 0.0  0.0 - 0.1 K/uL  GLUCOSE, CAPILLARY     Status: Abnormal   Collection Time    10/20/13  4:49 AM      Result Value Ref Range   Glucose-Capillary 150 (*) 70 - 99 mg/dL   Comment 1 Documented in Chart     Comment 2 Notify RN    GLUCOSE, CAPILLARY     Status: Abnormal   Collection Time    10/20/13  8:12 AM      Result Value Ref Range   Glucose-Capillary 141 (*) 70 - 99 mg/dL   Comment 1 Documented in Chart     Comment 2 Notify RN    LIPASE, BLOOD     Status: None   Collection Time    10/20/13  8:19 AM      Result Value Ref Range   Lipase 19  11 - 59 U/L  COMPREHENSIVE METABOLIC PANEL     Status: Abnormal   Collection Time    10/20/13  8:19 AM      Result Value Ref Range   Sodium 139  137 - 147 mEq/L   Potassium 3.8  3.7 - 5.3 mEq/L   Chloride 102  96 - 112 mEq/L   CO2 25  19 - 32 mEq/L   Glucose, Bld 148 (*) 70 - 99 mg/dL  BUN 10  6 - 23 mg/dL   Creatinine, Ser 0.57  0.50 - 1.10 mg/dL   Calcium 8.7  8.4 - 10.5 mg/dL   Total Protein 6.5  6.0 - 8.3 g/dL   Albumin 3.2 (*) 3.5 - 5.2 g/dL   AST 178 (*) 0 - 37 U/L   ALT 213 (*) 0 - 35 U/L   Alkaline Phosphatase 45  39 - 117 U/L   Total Bilirubin 0.3  0.3 - 1.2 mg/dL   GFR calc non Af Amer >90  >90 mL/min   GFR calc Af Amer >90  >90 mL/min   Comment: (NOTE)     The eGFR has been calculated using the CKD EPI equation.     This calculation has not been validated in all clinical situations.     eGFR's persistently <90 mL/min signify possible Chronic Kidney     Disease.  GLUCOSE, CAPILLARY     Status: None   Collection Time    10/20/13 12:18 PM      Result Value Ref Range   Glucose-Capillary 98  70 - 99 mg/dL   Comment 1 Documented in Chart     Comment 2 Notify RN    HEMOGLOBIN AND HEMATOCRIT, BLOOD     Status: None   Collection Time    10/20/13  4:00 PM      Result Value  Ref Range   Hemoglobin 13.8  12.0 - 15.0 g/dL   HCT 40.6  36.0 - 46.0 %  GLUCOSE, CAPILLARY     Status: Abnormal   Collection Time    10/20/13  4:18 PM      Result Value Ref Range   Glucose-Capillary 122 (*) 70 - 99 mg/dL   Comment 1 Documented in Chart     Comment 2 Notify RN    GLUCOSE, CAPILLARY     Status: None   Collection Time    10/20/13  8:07 PM      Result Value Ref Range   Glucose-Capillary 87  70 - 99 mg/dL   Comment 1 Notify RN    GLUCOSE, CAPILLARY     Status: Abnormal   Collection Time    10/20/13 11:49 PM      Result Value Ref Range   Glucose-Capillary 133 (*) 70 - 99 mg/dL   Comment 1 Documented in Chart     Comment 2 Notify RN    CBC WITH DIFFERENTIAL     Status: Abnormal   Collection Time    10/21/13  3:02 AM      Result Value Ref Range   WBC 18.0 (*) 4.0 - 10.5 K/uL   RBC 4.84  3.87 - 5.11 MIL/uL   Hemoglobin 13.8  12.0 - 15.0 g/dL   HCT 41.3  36.0 - 46.0 %   MCV 85.3  78.0 - 100.0 fL   MCH 28.5  26.0 - 34.0 pg   MCHC 33.4  30.0 - 36.0 g/dL   RDW 15.8 (*) 11.5 - 15.5 %   Platelets 260  150 - 400 K/uL   Neutrophils Relative % 63  43 - 77 %   Neutro Abs 11.2 (*) 1.7 - 7.7 K/uL   Lymphocytes Relative 27  12 - 46 %   Lymphs Abs 4.8 (*) 0.7 - 4.0 K/uL   Monocytes Relative 10  3 - 12 %   Monocytes Absolute 1.8 (*) 0.1 - 1.0 K/uL   Eosinophils Relative 0  0 - 5 %   Eosinophils Absolute 0.1  0.0 - 0.7 K/uL   Basophils Relative 0  0 - 1 %   Basophils Absolute 0.0  0.0 - 0.1 K/uL  GLUCOSE, CAPILLARY     Status: Abnormal   Collection Time    10/21/13  3:28 AM      Result Value Ref Range   Glucose-Capillary 133 (*) 70 - 99 mg/dL   Comment 1 Notify RN      Radiology/Results: Dg Ugi W/water Sol Cm  10/20/2013   CLINICAL DATA:  Status post gastric bypass  EXAM: WATER SOLUBLE UPPER GI SERIES  TECHNIQUE: Single-column upper GI series was performed using water soluble contrast.  CONTRAST:  82m OMNIPAQUE IOHEXOL 300 MG/ML  SOLN  COMPARISON:  Esophagram from  11/26/2012  FLUOROSCOPY TIME:  59 seconds  FINDINGS: Tertiary contractions were identified intermittently during the swallowing phase of the examination suggestive of presbyesophagus. A small hiatal hernia is again identified but this appears reduced from 11/26/2012. Postoperative appearance of the stomach is identified compatible with gastric bypass. No extravasation of contrast material identified suggestive of leakage. The gastro jejunal anastomosis appears patent.  IMPRESSION: 1. No abnormal extravasation of contrast material identified to suggest leak.  2.  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  3.  Small hiatal hernia.   Electronically Signed   By: TKerby MoorsM.D.   On: 10/20/2013 10:02    Anti-infectives: Anti-infectives   Start     Dose/Rate Route Frequency Ordered Stop   10/19/13 0600  cefOXitin (MEFOXIN) 2 g in dextrose 5 % 50 mL IVPB     2 g 100 mL/hr over 30 Minutes Intravenous On call to O.R. 10/19/13 0534 10/19/13 1154      Assessment/Plan: Problem List: Patient Active Problem List   Diagnosis Date Noted  . 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 S/P gastric bypass 10/19/2013  . Diabetes 09/29/2013  . [facility]  [facility]  . 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 Obesity, unspecified 10/22/2012    Doing well.  Taking po-advance to pd 2 diet. Transfer to floor  2 Days Post-Op    LOS: 2 days   Matt B. MHassell Done MD, [facility] [phone] [phone] 10/21/2013 8:46 AM

## 2013-10-21 NOTE — Progress Notes (Signed)
Pt c/o "feeling bad". Appears flushed, skin dry, warm to touch. VSS stable and as per document. Pt denies pain, N/V. Lung and heart sounds WNL. Bowel sounds, hypoactive, no change from previous. Will continue to monitor.

## 2013-10-22 ENCOUNTER — Encounter (HOSPITAL_COMMUNITY): Payer: Self-pay | Admitting: Radiology

## 2013-10-22 ENCOUNTER — Ambulatory Visit (HOSPITAL_COMMUNITY): Payer: Medicare Other

## 2013-10-22 ENCOUNTER — Inpatient Hospital Stay (HOSPITAL_COMMUNITY): Payer: Medicare Other

## 2013-10-22 LAB — CBC WITH DIFFERENTIAL/PLATELET
BASOS PCT: 0 % (ref 0–1)
Basophils Absolute: 0 10*3/uL (ref 0.0–0.1)
Eosinophils Absolute: 0 10*3/uL (ref 0.0–0.7)
Eosinophils Relative: 0 % (ref 0–5)
HEMATOCRIT: 44.4 % (ref 36.0–46.0)
Hemoglobin: 15.2 g/dL — ABNORMAL HIGH (ref 12.0–15.0)
Lymphocytes Relative: 15 % (ref 12–46)
Lymphs Abs: 2.3 10*3/uL (ref 0.7–4.0)
MCH: 29.2 pg (ref 26.0–34.0)
MCHC: 34.2 g/dL (ref 30.0–36.0)
MCV: 85.2 fL (ref 78.0–100.0)
MONO ABS: 1.2 10*3/uL — AB (ref 0.1–1.0)
Monocytes Relative: 8 % (ref 3–12)
NEUTROS ABS: 11.3 10*3/uL — AB (ref 1.7–7.7)
Neutrophils Relative %: 77 % (ref 43–77)
PLATELETS: 274 10*3/uL (ref 150–400)
RBC: 5.21 MIL/uL — ABNORMAL HIGH (ref 3.87–5.11)
RDW: 15.4 % (ref 11.5–15.5)
WBC: 14.8 10*3/uL — ABNORMAL HIGH (ref 4.0–10.5)

## 2013-10-22 LAB — GLUCOSE, CAPILLARY
GLUCOSE-CAPILLARY: 144 mg/dL — AB (ref 70–99)
GLUCOSE-CAPILLARY: 151 mg/dL — AB (ref 70–99)
Glucose-Capillary: 137 mg/dL — ABNORMAL HIGH (ref 70–99)
Glucose-Capillary: 151 mg/dL — ABNORMAL HIGH (ref 70–99)
Glucose-Capillary: 111 mg/dL — ABNORMAL HIGH (ref 70–99)

## 2013-10-22 LAB — COMPREHENSIVE METABOLIC PANEL
ALBUMIN: 2.9 g/dL — AB (ref 3.5–5.2)
ALT: 94 U/L — AB (ref 0–35)
AST: 32 U/L (ref 0–37)
Alkaline Phosphatase: 48 U/L (ref 39–117)
BUN: 7 mg/dL (ref 6–23)
CHLORIDE: 100 meq/L (ref 96–112)
CO2: 27 meq/L (ref 19–32)
Calcium: 8.7 mg/dL (ref 8.4–10.5)
Creatinine, Ser: 0.52 mg/dL (ref 0.50–1.10)
GFR calc Af Amer: >90 mL/min (ref >90)
Glucose, Bld: 126 mg/dL — ABNORMAL HIGH (ref 70–99)
Potassium: 3.2 mEq/L — ABNORMAL LOW (ref 3.7–5.3)
SODIUM: 140 meq/L (ref 137–147)
Total Bilirubin: 0.4 mg/dL (ref 0.3–1.2)
Total Protein: 6.9 g/dL (ref 6.0–8.3)

## 2013-10-22 LAB — LIPASE, BLOOD: Lipase: 12 U/L (ref 11–59)

## 2013-10-22 MED ORDER — IOHEXOL 300 MG/ML  SOLN
25.0000 mL | INTRAMUSCULAR | Status: AC
  Administered 2013-10-22: 25 mL via ORAL

## 2013-10-22 MED ORDER — IOHEXOL 300 MG/ML  SOLN
100.0000 mL | Freq: Once | INTRAMUSCULAR | Status: AC | PRN
  Administered 2013-10-22: 100 mL via INTRAVENOUS

## 2013-10-22 MED ORDER — IOHEXOL 300 MG/ML  SOLN: 25.0000 mL | Freq: Once | INTRAMUSCULAR | Status: DC | PRN

## 2013-10-22 MED ORDER — FLEET ENEMA 7-19 GM/118ML RE ENEM
1.0000 | ENEMA | Freq: Once | RECTAL | Status: AC
  Administered 2013-10-22: 16:00:00 via RECTAL
  Filled 2013-10-22: qty 1

## 2013-10-22 NOTE — Progress Notes (Signed)
Pt refusing protein shakes as ordered. Ambulated to bathroom only. VSS with minimal c/o pain.

## 2013-10-22 NOTE — Progress Notes (Signed)
Patient ID: Carol Jackson, female   DOB: Sep 30, 1945, 68 y.o.   MRN: [id number] [facility] Surgery Progress Note:   3 Days Post-Op  Subjective: Mental status is clear.  Had nausea and vomiting with getting up last night with dry heaves Objective: Vital signs in last 24 hours: Temp:  [98.1 F (36.7 C)-98.5 F (36.9 C)] 98.3 F (36.8 C) (06/05 1400) Pulse Rate:  [63-75] 69 (06/05 1400) Resp:  [16-18] 16 (06/05 1400) BP: (152-174)/(73-90) 156/73 mmHg (06/05 1400) SpO2:  [90 %-95 %] 95 % (06/05 1400)  Intake/Output from previous day: 06/04 0701 - 06/05 0700 In: 1986.8 [P.O.:201; I.V.:1785.8] Out: 3365 [Urine:3175; Drains:190] Intake/Output this shift: Total I/O In: 0  Out: 280 [Urine:200; Drains:80]  Physical Exam: Work of breathing is normal;  JP drain serous;  Incisions ok  Lab Results:  Results for orders placed during the hospital encounter of 10/19/13 (from the past 48 hour(s))  HEMOGLOBIN AND HEMATOCRIT, BLOOD     Status: None   Collection Time    10/20/13  4:00 PM      Result Value Ref Range   Hemoglobin 13.8  12.0 - 15.0 g/dL   HCT 40.6  36.0 - 46.0 %  GLUCOSE, CAPILLARY     Status: Abnormal   Collection Time    10/20/13  4:18 PM      Result Value Ref Range   Glucose-Capillary 122 (*) 70 - 99 mg/dL   Comment 1 Documented in Chart     Comment 2 Notify RN    GLUCOSE, CAPILLARY     Status: None   Collection Time    10/20/13  8:07 PM      Result Value Ref Range   Glucose-Capillary 87  70 - 99 mg/dL   Comment 1 Notify RN    GLUCOSE, CAPILLARY     Status: Abnormal   Collection Time    10/20/13 11:49 PM      Result Value Ref Range   Glucose-Capillary 133 (*) 70 - 99 mg/dL   Comment 1 Documented in Chart     Comment 2 Notify RN    CBC WITH DIFFERENTIAL     Status: Abnormal   Collection Time    10/21/13  3:02 AM      Result Value Ref Range   WBC 18.0 (*) 4.0 - 10.5 K/uL   RBC 4.84  3.87 - 5.11 MIL/uL   Hemoglobin 13.8  12.0 - 15.0 g/dL   HCT 41.3   36.0 - 46.0 %   MCV 85.3  78.0 - 100.0 fL   MCH 28.5  26.0 - 34.0 pg   MCHC 33.4  30.0 - 36.0 g/dL   RDW 15.8 (*) 11.5 - 15.5 %   Platelets 260  150 - 400 K/uL   Neutrophils Relative % 63  43 - 77 %   Neutro Abs 11.2 (*) 1.7 - 7.7 K/uL   Lymphocytes Relative 27  12 - 46 %   Lymphs Abs 4.8 (*) 0.7 - 4.0 K/uL   Monocytes Relative 10  3 - 12 %   Monocytes Absolute 1.8 (*) 0.1 - 1.0 K/uL   Eosinophils Relative 0  0 - 5 %   Eosinophils Absolute 0.1  0.0 - 0.7 K/uL   Basophils Relative 0  0 - 1 %   Basophils Absolute 0.0  0.0 - 0.1 K/uL  GLUCOSE, CAPILLARY     Status: Abnormal   Collection Time    10/21/13  3:28 AM  Result Value Ref Range   Glucose-Capillary 133 (*) 70 - 99 mg/dL   Comment 1 Notify RN    GLUCOSE, CAPILLARY     Status: Abnormal   Collection Time    10/21/13  8:20 AM      Result Value Ref Range   Glucose-Capillary 128 (*) 70 - 99 mg/dL   Comment 1 Documented in Chart     Comment 2 Notify RN    GLUCOSE, CAPILLARY     Status: Abnormal   Collection Time    10/21/13 12:45 PM      Result Value Ref Range   Glucose-Capillary 100 (*) 70 - 99 mg/dL   Comment 1 Documented in Chart     Comment 2 Notify RN    GLUCOSE, CAPILLARY     Status: Abnormal   Collection Time    10/21/13  4:28 PM      Result Value Ref Range   Glucose-Capillary 112 (*) 70 - 99 mg/dL   Comment 1 Notify RN     Comment 2 Documented in Chart    GLUCOSE, CAPILLARY     Status: Abnormal   Collection Time    10/21/13  8:28 PM      Result Value Ref Range   Glucose-Capillary 135 (*) 70 - 99 mg/dL  GLUCOSE, CAPILLARY     Status: Abnormal   Collection Time    10/21/13 11:55 PM      Result Value Ref Range   Glucose-Capillary 129 (*) 70 - 99 mg/dL  GLUCOSE, CAPILLARY     Status: Abnormal   Collection Time    10/22/13  4:09 AM      Result Value Ref Range   Glucose-Capillary 137 (*) 70 - 99 mg/dL  CBC WITH DIFFERENTIAL     Status: Abnormal   Collection Time    10/22/13  4:15 AM      Result Value  Ref Range   WBC 14.8 (*) 4.0 - 10.5 K/uL   RBC 5.21 (*) 3.87 - 5.11 MIL/uL   Hemoglobin 15.2 (*) 12.0 - 15.0 g/dL   HCT 44.4  36.0 - 46.0 %   MCV 85.2  78.0 - 100.0 fL   MCH 29.2  26.0 - 34.0 pg   MCHC 34.2  30.0 - 36.0 g/dL   RDW 15.4  11.5 - 15.5 %   Platelets 274  150 - 400 K/uL   Neutrophils Relative % 77  43 - 77 %   Neutro Abs 11.3 (*) 1.7 - 7.7 K/uL   Lymphocytes Relative 15  12 - 46 %   Lymphs Abs 2.3  0.7 - 4.0 K/uL   Monocytes Relative 8  3 - 12 %   Monocytes Absolute 1.2 (*) 0.1 - 1.0 K/uL   Eosinophils Relative 0  0 - 5 %   Eosinophils Absolute 0.0  0.0 - 0.7 K/uL   Basophils Relative 0  0 - 1 %   Basophils Absolute 0.0  0.0 - 0.1 K/uL  GLUCOSE, CAPILLARY     Status: Abnormal   Collection Time    10/22/13  7:27 AM      Result Value Ref Range   Glucose-Capillary 144 (*) 70 - 99 mg/dL  LIPASE, BLOOD     Status: None   Collection Time    10/22/13 10:24 AM      Result Value Ref Range   Lipase 12  11 - 59 U/L  COMPREHENSIVE METABOLIC PANEL     Status: Abnormal   Collection Time  10/22/13 10:24 AM      Result Value Ref Range   Sodium 140  137 - 147 mEq/L   Potassium 3.2 (*) 3.7 - 5.3 mEq/L   Chloride 100  96 - 112 mEq/L   CO2 27  19 - 32 mEq/L   Glucose, Bld 126 (*) 70 - 99 mg/dL   BUN 7  6 - 23 mg/dL   Creatinine, Ser 0.52  0.50 - 1.10 mg/dL   Calcium 8.7  8.4 - 10.5 mg/dL   Total Protein 6.9  6.0 - 8.3 g/dL   Albumin 2.9 (*) 3.5 - 5.2 g/dL   AST 32  0 - 37 U/L   ALT 94 (*) 0 - 35 U/L   Alkaline Phosphatase 48  39 - 117 U/L   Total Bilirubin 0.4  0.3 - 1.2 mg/dL   GFR calc non Af Amer >90  >90 mL/min   GFR calc Af Amer >90  >90 mL/min   Comment: (NOTE)     The eGFR has been calculated using the CKD EPI equation.     This calculation has not been validated in all clinical situations.     eGFR's persistently <90 mL/min signify possible Chronic Kidney     Disease.  GLUCOSE, CAPILLARY     Status: Abnormal   Collection Time    10/22/13 12:10 PM       Result Value Ref Range   Glucose-Capillary 151 (*) 70 - 99 mg/dL    Radiology/Results: Ct Abdomen Pelvis W Contrast  10/22/2013   CLINICAL DATA:  Postop gastric bypass with prior distal pancreatectomy. Vomiting. Assess for abdominal wall hernia. Evaluate for leak.  EXAM: CT ABDOMEN AND PELVIS WITH CONTRAST  TECHNIQUE: Multidetector CT imaging of the abdomen and pelvis was performed using the standard protocol following bolus administration of intravenous contrast.  CONTRAST:  160m OMNIPAQUE IOHEXOL 300 MG/ML  SOLN  COMPARISON:  UGI 10/20/2013 and PET-CT 04/14/2012  FINDINGS: Lung bases demonstrate a small left pleural effusion. There is mild consolidation over the posterior left lower lobe likely atelectasis. Pleural-based 6 mm nodule over the left lower lobe adjacent the hemidiaphragm. Minimal dependent atelectasis posterior right base. Small hiatal hernia.  Abdominal images demonstrate a surgical drain entering the left lateral mid abdominal wall extending to lie with its tip lying adjacent the lesser curvature of the stomach. There is a surgical suture line over the stomach compatible patient's recent gastric bypass. The region of the gastrojejunostomy anastomosis is unremarkable. Surgical clips are present over the left upper quadrant compatible with recent partial pancreatectomy. There is a surgical suture line over several small bowel loops in the left mid abdomen. There is mild air within the soft tissues of the abdominal wall compatible patient's recent surgery.  There is mild diffuse hepatic steatosis. Visualized portion of the body and head of the pancreas is within normal. The gallbladder, right adrenal gland and kidneys are within normal. There is a 1.7 cm left adrenal nodule unchanged. 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 Appendix is normal. There is no free peritoneal air or focal inflammatory change. There is a tiny amount of free fluid over the right pericolic gutter inferior to the liver. There  is minimal calcified plaque over the abdominal aorta and iliac arteries. There is minimal prominence of the at ascending and transverse colon with abrupt transition to a decompressed descending and rectosigmoid colon in the left upper quadrant as cannot completely exclude a early/partially obstructing process of the colon in the left upper quadrant.  Pelvic images demonstrate multiple phleboliths. Stable 2.3 cm cyst along the left side of the vaginal wall there are degenerative changes of the spine.  IMPRESSION: Multiple postoperative changes compatible patient's recent gastric bypass procedure and distal pancreatectomy and splenectomy. The gastric jejunal anastomosis is unremarkable. Surgical drain in place with tip along the lesser curvature of the stomach.  Mild prominence of the ascending hand transverse colon to the region of the splenic flexure in the left upper quadrant with there is an abrupt transition to a decompressed descending colon/rectosigmoid colon. Cannot exclude a early/partial colonic obstructive process in this region possibly secondary to adhesions. Recommend serial follow-up abdominal films as clinically indicated.  Hepatic steatosis.  Small hiatal hernia.  Small left pleural effusion with bibasilar atelectasis.  6 mm subpleural nodule over the right base. Recommend followup chest CT in 6 months.  This recommendation follows the consensus statement: Guidelines for Management of Small Pulmonary Nodules Detected on CT Scans: A Statement from the [employer] as published in Radiology 2005; 237:395-400. Online at: [email or web address]/.  Small left adrenal nodule unchanged likely an adenoma.   Electronically Signed   By: Marin Olp M.D.   On: 10/22/2013 13:35    Anti-infectives: Anti-infectives   Start     Dose/Rate Route Frequency Ordered Stop   10/19/13 0600  cefOXitin (MEFOXIN) 2 g in dextrose 5 % 50 mL IVPB     2 g 100 mL/hr over 30 Minutes  Intravenous On call to O.R. 10/19/13 0534 10/19/13 1154      Assessment/Plan: Problem List: Patient Active Problem List   Diagnosis Date Noted  . 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 S/P gastric bypass 10/19/2013  . Diabetes 09/29/2013  . Thoracotomy scar of right chest-decortication at [facility] 09/29/2013  . 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 09/29/2013  . 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 Obesity, unspecified 10/22/2012    CT scan showed transition in colon and this was noted when looking for LT during surgery.  Seemed more posterior but otherwise normal.  Will give enema and remove JP which is in the area.   Hypokalemia noted and being treated with IV maintenance;  Acute K level likely secondary to vomiting.  3 Days Post-Op    LOS: 3 days   Matt B. Hassell Done, MD, [facility] [phone] beeper [phone]  10/22/2013 2:31 PM

## 2013-10-22 NOTE — Progress Notes (Signed)
Patient not feeling well this morning.  Complains of nausea and vomiting over night and constant belching.  She has been refusing protein shake over night due to nausea, not taking any pain medication, no complaints of pain.  Notified Dr. Hassell Done who came to floor to assess patient.  Will continue to monitor.  Vilinda Flake RN Bariatric Nurse Coordinator.

## 2013-10-23 LAB — GLUCOSE, CAPILLARY
GLUCOSE-CAPILLARY: 143 mg/dL — AB (ref 70–99)
GLUCOSE-CAPILLARY: 136 mg/dL — AB (ref 70–99)
GLUCOSE-CAPILLARY: 116 mg/dL — AB (ref 70–99)
GLUCOSE-CAPILLARY: 148 mg/dL — AB (ref 70–99)
Glucose-Capillary: 104 mg/dL — ABNORMAL HIGH (ref 70–99)
Glucose-Capillary: 125 mg/dL — ABNORMAL HIGH (ref 70–99)

## 2013-10-23 MED ORDER — PROMETHAZINE HCL 25 MG/ML IJ SOLN
12.5000 mg | Freq: Four times a day (QID) | INTRAMUSCULAR | Status: DC | PRN
  Administered 2013-10-23 – 2013-10-28 (×3): 12.5 mg via INTRAVENOUS
  Filled 2013-10-23 (×3): qty 1

## 2013-10-23 MED ORDER — POTASSIUM CHLORIDE 10 MEQ/100ML IV SOLN
10.0000 meq | INTRAVENOUS | Status: AC
  Administered 2013-10-23 (×4): 10 meq via INTRAVENOUS
  Filled 2013-10-23 (×4): qty 100

## 2013-10-23 MED ORDER — PANTOPRAZOLE SODIUM 40 MG IV SOLR
40.0000 mg | Freq: Two times a day (BID) | INTRAVENOUS | Status: DC
  Administered 2013-10-23 – 2013-11-09 (×31): 40 mg via INTRAVENOUS
  Filled 2013-10-23 (×37): qty 40

## 2013-10-23 MED ORDER — FLEET ENEMA 7-19 GM/118ML RE ENEM
1.0000 | ENEMA | Freq: Once | RECTAL | Status: AC
  Administered 2013-10-23: 1 via RECTAL
  Filled 2013-10-23: qty 1

## 2013-10-23 NOTE — Progress Notes (Signed)
Patient ID: Carol Jackson, female   DOB: 02/11/1946, 68 y.o.   MRN: [id number] [facility] Surgery Progress Note:   3 Days Post-Op  Subjective: Patient frustrated, having nausea, belching and bloating.  C/O GERD type symptoms.  Not tolerating much PO Objective: Vital signs in last 24 hours: Temp:  [98.1 F (36.7 C)-99 F (37.2 C)] 99 F (37.2 C) (06/06 0630) Pulse Rate:  [61-69] 61 (06/06 0630) Resp:  [16-18] 16 (06/06 0630) BP: (147-156)/(67-87) 154/82 mmHg (06/06 0630) SpO2:  [91 %-95 %] 92 % (06/06 0630)  Intake/Output from previous day: 06/05 0701 - 06/06 0700 In: 3040.8 [P.O.:240; I.V.:2800.8] Out: 2920 [Urine:2500; Drains:420] Intake/Output this shift:    Physical Exam: Work of breathing is normal;  JP drain serous;  Incisions ok  Lab Results:  Results for orders placed during the hospital encounter of 10/19/13 (from the past 48 hour(s))  GLUCOSE, CAPILLARY     Status: Abnormal   Collection Time    10/21/13 12:45 PM      Result Value Ref Range   Glucose-Capillary 100 (*) 70 - 99 mg/dL   Comment 1 Documented in Chart     Comment 2 Notify RN    GLUCOSE, CAPILLARY     Status: Abnormal   Collection Time    10/21/13  4:28 PM      Result Value Ref Range   Glucose-Capillary 112 (*) 70 - 99 mg/dL   Comment 1 Notify RN     Comment 2 Documented in Chart    GLUCOSE, CAPILLARY     Status: Abnormal   Collection Time    10/21/13  8:28 PM      Result Value Ref Range   Glucose-Capillary 135 (*) 70 - 99 mg/dL  GLUCOSE, CAPILLARY     Status: Abnormal   Collection Time    10/21/13 11:55 PM      Result Value Ref Range   Glucose-Capillary 129 (*) 70 - 99 mg/dL  GLUCOSE, CAPILLARY     Status: Abnormal   Collection Time    10/22/13  4:09 AM      Result Value Ref Range   Glucose-Capillary 137 (*) 70 - 99 mg/dL  CBC WITH DIFFERENTIAL     Status: Abnormal   Collection Time    10/22/13  4:15 AM      Result Value Ref Range   WBC 14.8 (*) 4.0 - 10.5 K/uL   RBC 5.21 (*)  3.87 - 5.11 MIL/uL   Hemoglobin 15.2 (*) 12.0 - 15.0 g/dL   HCT 44.4  36.0 - 46.0 %   MCV 85.2  78.0 - 100.0 fL   MCH 29.2  26.0 - 34.0 pg   MCHC 34.2  30.0 - 36.0 g/dL   RDW 15.4  11.5 - 15.5 %   Platelets 274  150 - 400 K/uL   Neutrophils Relative % 77  43 - 77 %   Neutro Abs 11.3 (*) 1.7 - 7.7 K/uL   Lymphocytes Relative 15  12 - 46 %   Lymphs Abs 2.3  0.7 - 4.0 K/uL   Monocytes Relative 8  3 - 12 %   Monocytes Absolute 1.2 (*) 0.1 - 1.0 K/uL   Eosinophils Relative 0  0 - 5 %   Eosinophils Absolute 0.0  0.0 - 0.7 K/uL   Basophils Relative 0  0 - 1 %   Basophils Absolute 0.0  0.0 - 0.1 K/uL  GLUCOSE, CAPILLARY     Status: Abnormal   Collection Time  10/22/13  7:27 AM      Result Value Ref Range   Glucose-Capillary 144 (*) 70 - 99 mg/dL  LIPASE, BLOOD     Status: None   Collection Time    10/22/13 10:24 AM      Result Value Ref Range   Lipase 12  11 - 59 U/L  COMPREHENSIVE METABOLIC PANEL     Status: Abnormal   Collection Time    10/22/13 10:24 AM      Result Value Ref Range   Sodium 140  137 - 147 mEq/L   Potassium 3.2 (*) 3.7 - 5.3 mEq/L   Chloride 100  96 - 112 mEq/L   CO2 27  19 - 32 mEq/L   Glucose, Bld 126 (*) 70 - 99 mg/dL   BUN 7  6 - 23 mg/dL   Creatinine, Ser 0.52  0.50 - 1.10 mg/dL   Calcium 8.7  8.4 - 10.5 mg/dL   Total Protein 6.9  6.0 - 8.3 g/dL   Albumin 2.9 (*) 3.5 - 5.2 g/dL   AST 32  0 - 37 U/L   ALT 94 (*) 0 - 35 U/L   Alkaline Phosphatase 48  39 - 117 U/L   Total Bilirubin 0.4  0.3 - 1.2 mg/dL   GFR calc non Af Amer >90  >90 mL/min   GFR calc Af Amer >90  >90 mL/min   Comment: (NOTE)     The eGFR has been calculated using the CKD EPI equation.     This calculation has not been validated in all clinical situations.     eGFR's persistently <90 mL/min signify possible Chronic Kidney     Disease.  GLUCOSE, CAPILLARY     Status: Abnormal   Collection Time    10/22/13 12:10 PM      Result Value Ref Range   Glucose-Capillary 151 (*) 70 - 99  mg/dL  GLUCOSE, CAPILLARY     Status: Abnormal   Collection Time    10/22/13  3:49 PM      Result Value Ref Range   Glucose-Capillary 111 (*) 70 - 99 mg/dL  GLUCOSE, CAPILLARY     Status: Abnormal   Collection Time    10/22/13  7:53 PM      Result Value Ref Range   Glucose-Capillary 151 (*) 70 - 99 mg/dL  GLUCOSE, CAPILLARY     Status: Abnormal   Collection Time    10/23/13 12:24 AM      Result Value Ref Range   Glucose-Capillary 125 (*) 70 - 99 mg/dL  GLUCOSE, CAPILLARY     Status: Abnormal   Collection Time    10/23/13  3:49 AM      Result Value Ref Range   Glucose-Capillary 143 (*) 70 - 99 mg/dL  GLUCOSE, CAPILLARY     Status: Abnormal   Collection Time    10/23/13  7:08 AM      Result Value Ref Range   Glucose-Capillary 136 (*) 70 - 99 mg/dL    Radiology/Results: Ct Abdomen Pelvis W Contrast  10/22/2013   CLINICAL DATA:  Postop gastric bypass with prior distal pancreatectomy. Vomiting. Assess for abdominal wall hernia. Evaluate for leak.  EXAM: CT ABDOMEN AND PELVIS WITH CONTRAST  TECHNIQUE: Multidetector CT imaging of the abdomen and pelvis was performed using the standard protocol following bolus administration of intravenous contrast.  CONTRAST:  146m OMNIPAQUE IOHEXOL 300 MG/ML  SOLN  COMPARISON:  UGI 10/20/2013 and PET-CT 04/14/2012  FINDINGS: Lung bases  demonstrate a small left pleural effusion. There is mild consolidation over the posterior left lower lobe likely atelectasis. Pleural-based 6 mm nodule over the left lower lobe adjacent the hemidiaphragm. Minimal dependent atelectasis posterior right base. Small hiatal hernia.  Abdominal images demonstrate a surgical drain entering the left lateral mid abdominal wall extending to lie with its tip lying adjacent the lesser curvature of the stomach. There is a surgical suture line over the stomach compatible patient's recent gastric bypass. The region of the gastrojejunostomy anastomosis is unremarkable. Surgical clips are  present over the left upper quadrant compatible with recent partial pancreatectomy. There is a surgical suture line over several small bowel loops in the left mid abdomen. There is mild air within the soft tissues of the abdominal wall compatible patient's recent surgery.  There is mild diffuse hepatic steatosis. Visualized portion of the body and head of the pancreas is within normal. The gallbladder, right adrenal gland and kidneys are within normal. There is a 1.7 cm left adrenal nodule unchanged. 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 Appendix is normal. There is no free peritoneal air or focal inflammatory change. There is a tiny amount of free fluid over the right pericolic gutter inferior to the liver. There is minimal calcified plaque over the abdominal aorta and iliac arteries. There is minimal prominence of the at ascending and transverse colon with abrupt transition to a decompressed descending and rectosigmoid colon in the left upper quadrant as cannot completely exclude a early/partially obstructing process of the colon in the left upper quadrant.  Pelvic images demonstrate multiple phleboliths. Stable 2.3 cm cyst along the left side of the vaginal wall there are degenerative changes of the spine.  IMPRESSION: Multiple postoperative changes compatible patient's recent gastric bypass procedure and distal pancreatectomy and splenectomy. The gastric jejunal anastomosis is unremarkable. Surgical drain in place with tip along the lesser curvature of the stomach.  Mild prominence of the ascending hand transverse colon to the region of the splenic flexure in the left upper quadrant with there is an abrupt transition to a decompressed descending colon/rectosigmoid colon. Cannot exclude a early/partial colonic obstructive process in this region possibly secondary to adhesions. Recommend serial follow-up abdominal films as clinically indicated.  Hepatic steatosis.  Small hiatal hernia.  Small left pleural effusion  with bibasilar atelectasis.  6 mm subpleural nodule over the right base. Recommend followup chest CT in 6 months.  This recommendation follows the consensus statement: Guidelines for Management of Small Pulmonary Nodules Detected on CT Scans: A Statement from the [place] as published in Radiology 2005; 237:395-400. Online at: [email or web address]/.  Small left adrenal nodule unchanged likely an adenoma.   Electronically Signed   By: Marin Olp M.D.   On: 10/22/2013 13:35    Anti-infectives: Anti-infectives   Start     Dose/Rate Route Frequency Ordered Stop   10/19/13 0600  cefOXitin (MEFOXIN) 2 g in dextrose 5 % 50 mL IVPB     2 g 100 mL/hr over 30 Minutes Intravenous On call to O.R. 10/19/13 0534 10/19/13 1154      Assessment/Plan: Problem List: Patient Active Problem List   Diagnosis Date Noted  . 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 S/P gastric bypass 10/19/2013  . Diabetes 09/29/2013  . Thoracotomy scar of right chest-decortication at [facility] 09/29/2013  . Post-pancreatectomy diabetes-splenectomy at [facility] 09/29/2013  . 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 Obesity, unspecified 10/22/2012    CT  scan showed transition in colon and this was noted when looking for LT during surgery.  Seemed more posterior but otherwise normal.    Will give repeat enema and remove JP which is in the area.   Hypokalemia noted and being treated with IV maintenance;  Acute K level likely secondary to vomiting.  Will recheck in AM  3 Days Post-Op    LOS: 4 days   Rosario Adie, MD  [facility]   10/23/2013 9:54 AM

## 2013-10-24 ENCOUNTER — Inpatient Hospital Stay (HOSPITAL_COMMUNITY): Payer: Medicare Other | Admitting: Anesthesiology

## 2013-10-24 ENCOUNTER — Inpatient Hospital Stay (HOSPITAL_COMMUNITY): Payer: Medicare Other

## 2013-10-24 ENCOUNTER — Encounter (HOSPITAL_COMMUNITY)
Admission: RE | Disposition: A | Discharge status: Home Health | Payer: Self-pay | Source: Ambulatory Visit | Attending: Surgery

## 2013-10-24 ENCOUNTER — Encounter (HOSPITAL_COMMUNITY): Payer: Medicare Other | Admitting: Anesthesiology

## 2013-10-24 DIAGNOSIS — K56609 Unspecified intestinal obstruction, unspecified as to partial versus complete obstruction: Secondary | ICD-10-CM

## 2013-10-24 HISTORY — PX: CENTRAL VENOUS CATHETER INSERTION: SHX401

## 2013-10-24 HISTORY — PX: LAPAROSCOPY: SHX197

## 2013-10-24 LAB — CBC WITH DIFFERENTIAL/PLATELET
Basophils Absolute: 0 10*3/uL (ref 0.0–0.1)
Basophils Relative: 0 % (ref 0–1)
EOS ABS: 0.1 10*3/uL (ref 0.0–0.7)
Eosinophils Relative: 0 % (ref 0–5)
HCT: 46.6 % — ABNORMAL HIGH (ref 36.0–46.0)
HEMOGLOBIN: 15.9 g/dL — AB (ref 12.0–15.0)
LYMPHS ABS: 3.6 10*3/uL (ref 0.7–4.0)
LYMPHS PCT: 22 % (ref 12–46)
MCH: 29.1 pg (ref 26.0–34.0)
MCHC: 34.1 g/dL (ref 30.0–36.0)
MCV: 85.3 fL (ref 78.0–100.0)
MONOS PCT: 17 % — AB (ref 3–12)
Monocytes Absolute: 2.8 10*3/uL — ABNORMAL HIGH (ref 0.1–1.0)
NEUTROS PCT: 61 % (ref 43–77)
Neutro Abs: 10.2 10*3/uL — ABNORMAL HIGH (ref 1.7–7.7)
PLATELETS: 317 10*3/uL (ref 150–400)
RBC: 5.46 MIL/uL — AB (ref 3.87–5.11)
RDW: 15.4 % (ref 11.5–15.5)
WBC: 16.7 10*3/uL — AB (ref 4.0–10.5)

## 2013-10-24 LAB — GLUCOSE, CAPILLARY
GLUCOSE-CAPILLARY: 136 mg/dL — AB (ref 70–99)
GLUCOSE-CAPILLARY: 113 mg/dL — AB (ref 70–99)
Glucose-Capillary: 107 mg/dL — ABNORMAL HIGH (ref 70–99)
Glucose-Capillary: 129 mg/dL — ABNORMAL HIGH (ref 70–99)
Glucose-Capillary: 134 mg/dL — ABNORMAL HIGH (ref 70–99)
Glucose-Capillary: 135 mg/dL — ABNORMAL HIGH (ref 70–99)
Glucose-Capillary: 135 mg/dL — ABNORMAL HIGH (ref 70–99)

## 2013-10-24 LAB — BASIC METABOLIC PANEL
BUN: 10 mg/dL (ref 6–23)
CO2: 23 meq/L (ref 19–32)
Calcium: 8.5 mg/dL (ref 8.4–10.5)
Chloride: 99 mEq/L (ref 96–112)
Creatinine, Ser: 0.79 mg/dL (ref 0.50–1.10)
GFR calc Af Amer: >90 mL/min (ref >90)
GFR calc non Af Amer: 85 mL/min — ABNORMAL LOW (ref >90)
Glucose, Bld: 133 mg/dL — ABNORMAL HIGH (ref 70–99)
POTASSIUM: 3.9 meq/L (ref 3.7–5.3)
SODIUM: 136 meq/L — AB (ref 137–147)

## 2013-10-24 SURGERY — LAPAROSCOPY, DIAGNOSTIC
Anesthesia: General | Site: Neck | Laterality: Right

## 2013-10-24 MED ORDER — BUPIVACAINE HCL (PF) 0.25 % IJ SOLN
INTRAMUSCULAR | Status: DC | PRN
  Administered 2013-10-24: 10 mL

## 2013-10-24 MED ORDER — PROMETHAZINE HCL 25 MG/ML IJ SOLN: 6.2500 mg | INTRAMUSCULAR | Status: DC | PRN

## 2013-10-24 MED ORDER — HYDROMORPHONE HCL PF 1 MG/ML IJ SOLN
INTRAMUSCULAR | Status: AC
  Filled 2013-10-24: qty 1

## 2013-10-24 MED ORDER — PROPOFOL 10 MG/ML IV BOLUS
INTRAVENOUS | Status: DC | PRN
  Administered 2013-10-24: 200 mg via INTRAVENOUS

## 2013-10-24 MED ORDER — NEOSTIGMINE METHYLSULFATE 10 MG/10ML IV SOLN
INTRAVENOUS | Status: DC | PRN
  Administered 2013-10-24: 5 mg via INTRAVENOUS

## 2013-10-24 MED ORDER — 0.9 % SODIUM CHLORIDE (POUR BTL) OPTIME
TOPICAL | Status: DC | PRN
  Administered 2013-10-24 (×2): 1000 mL

## 2013-10-24 MED ORDER — LACTATED RINGERS IV SOLN
INTRAVENOUS | Status: DC
  Administered 2013-10-24 – 2013-10-25 (×2): via INTRAVENOUS

## 2013-10-24 MED ORDER — BUPIVACAINE-EPINEPHRINE (PF) 0.25% -1:200000 IJ SOLN
INTRAMUSCULAR | Status: AC
  Filled 2013-10-24: qty 30

## 2013-10-24 MED ORDER — SUCCINYLCHOLINE CHLORIDE 20 MG/ML IJ SOLN
INTRAMUSCULAR | Status: DC | PRN
  Administered 2013-10-24: 140 mg via INTRAVENOUS

## 2013-10-24 MED ORDER — LABETALOL HCL 5 MG/ML IV SOLN
INTRAVENOUS | Status: DC | PRN
  Administered 2013-10-24 (×2): 5 mg via INTRAVENOUS

## 2013-10-24 MED ORDER — CEFOXITIN SODIUM 2 G IV SOLR
INTRAVENOUS | Status: AC
  Filled 2013-10-24: qty 2

## 2013-10-24 MED ORDER — ROCURONIUM BROMIDE 100 MG/10ML IV SOLN
INTRAVENOUS | Status: AC
  Filled 2013-10-24: qty 1

## 2013-10-24 MED ORDER — SODIUM CHLORIDE 0.9 % IV BOLUS (SEPSIS)
1000.0000 mL | Freq: Once | INTRAVENOUS | Status: AC
  Administered 2013-10-24: 1000 mL via INTRAVENOUS

## 2013-10-24 MED ORDER — GLYCOPYRROLATE 0.2 MG/ML IJ SOLN
INTRAMUSCULAR | Status: AC
  Filled 2013-10-24: qty 3

## 2013-10-24 MED ORDER — PHENYLEPHRINE HCL 10 MG/ML IJ SOLN
INTRAMUSCULAR | Status: DC | PRN
  Administered 2013-10-24 (×2): 80 ug via INTRAVENOUS
  Administered 2013-10-24 (×2): 120 ug via INTRAVENOUS
  Administered 2013-10-24: 80 ug via INTRAVENOUS

## 2013-10-24 MED ORDER — PROPOFOL 10 MG/ML IV BOLUS
INTRAVENOUS | Status: AC
  Filled 2013-10-24: qty 20

## 2013-10-24 MED ORDER — PHENYLEPHRINE 40 MCG/ML (10ML) SYRINGE FOR IV PUSH (FOR BLOOD PRESSURE SUPPORT)
PREFILLED_SYRINGE | INTRAVENOUS | Status: AC
  Filled 2013-10-24: qty 10

## 2013-10-24 MED ORDER — DEXTROSE 5 % IV SOLN
INTRAVENOUS | Status: AC
  Filled 2013-10-24: qty 2

## 2013-10-24 MED ORDER — FENTANYL CITRATE 0.05 MG/ML IJ SOLN
INTRAMUSCULAR | Status: AC
  Filled 2013-10-24: qty 5

## 2013-10-24 MED ORDER — LACTATED RINGERS IV SOLN: Freq: Once | INTRAVENOUS | Status: DC

## 2013-10-24 MED ORDER — LABETALOL HCL 5 MG/ML IV SOLN
INTRAVENOUS | Status: AC
  Filled 2013-10-24: qty 4

## 2013-10-24 MED ORDER — DEXTROSE 5 % IV SOLN
2.0000 g | INTRAVENOUS | Status: DC | PRN
  Administered 2013-10-24 (×2): 2 g via INTRAVENOUS

## 2013-10-24 MED ORDER — DEXTROSE 5 % IV SOLN: 2.0000 g | INTRAVENOUS | Status: DC | PRN

## 2013-10-24 MED ORDER — MIDAZOLAM HCL 5 MG/5ML IJ SOLN
INTRAMUSCULAR | Status: DC | PRN
  Administered 2013-10-24: 2 mg via INTRAVENOUS

## 2013-10-24 MED ORDER — MEPERIDINE HCL 50 MG/ML IJ SOLN
6.2500 mg | INTRAMUSCULAR | Status: DC | PRN
Start: 2013-10-24 — End: 2013-10-24

## 2013-10-24 MED ORDER — LACTATED RINGERS IV SOLN
INTRAVENOUS | Status: DC | PRN
  Administered 2013-10-24 (×3): via INTRAVENOUS

## 2013-10-24 MED ORDER — ROCURONIUM BROMIDE 100 MG/10ML IV SOLN
INTRAVENOUS | Status: DC | PRN
  Administered 2013-10-24: 40 mg via INTRAVENOUS
  Administered 2013-10-24: 10 mg via INTRAVENOUS
  Administered 2013-10-24 (×2): 5 mg via INTRAVENOUS
  Administered 2013-10-24: 10 mg via INTRAVENOUS

## 2013-10-24 MED ORDER — SODIUM CHLORIDE 0.9 % IJ SOLN
INTRAMUSCULAR | Status: AC
  Filled 2013-10-24: qty 10

## 2013-10-24 MED ORDER — ONDANSETRON HCL 4 MG/2ML IJ SOLN
INTRAMUSCULAR | Status: AC
  Filled 2013-10-24: qty 2

## 2013-10-24 MED ORDER — GLYCOPYRROLATE 0.2 MG/ML IJ SOLN
INTRAMUSCULAR | Status: DC | PRN
  Administered 2013-10-24: 0.6 mg via INTRAVENOUS

## 2013-10-24 MED ORDER — BUPIVACAINE HCL (PF) 0.25 % IJ SOLN
INTRAMUSCULAR | Status: AC
  Filled 2013-10-24: qty 30

## 2013-10-24 MED ORDER — HYDROMORPHONE HCL PF 1 MG/ML IJ SOLN
INTRAMUSCULAR | Status: DC | PRN
  Administered 2013-10-24 (×3): 0.5 mg via INTRAVENOUS

## 2013-10-24 MED ORDER — LIDOCAINE HCL (CARDIAC) 20 MG/ML IV SOLN
INTRAVENOUS | Status: DC | PRN
  Administered 2013-10-24: 100 mg via INTRAVENOUS

## 2013-10-24 MED ORDER — HYDROMORPHONE HCL PF 2 MG/ML IJ SOLN
INTRAMUSCULAR | Status: AC
  Filled 2013-10-24: qty 1

## 2013-10-24 MED ORDER — MIDAZOLAM HCL 2 MG/2ML IJ SOLN
INTRAMUSCULAR | Status: AC
  Filled 2013-10-24: qty 2

## 2013-10-24 MED ORDER — HYDROMORPHONE HCL PF 1 MG/ML IJ SOLN: 0.2500 mg | INTRAMUSCULAR | Status: DC | PRN

## 2013-10-24 MED ORDER — ACETAMINOPHEN 10 MG/ML IV SOLN
1000.0000 mg | Freq: Once | INTRAVENOUS | Status: AC
  Administered 2013-10-24: 1000 mg via INTRAVENOUS
  Filled 2013-10-24: qty 100

## 2013-10-24 MED ORDER — FENTANYL CITRATE 0.05 MG/ML IJ SOLN
INTRAMUSCULAR | Status: DC | PRN
  Administered 2013-10-24: 100 ug via INTRAVENOUS
  Administered 2013-10-24 (×5): 50 ug via INTRAVENOUS
  Administered 2013-10-24: 100 ug via INTRAVENOUS
  Administered 2013-10-24: 50 ug via INTRAVENOUS

## 2013-10-24 MED ORDER — EPHEDRINE SULFATE 50 MG/ML IJ SOLN
INTRAMUSCULAR | Status: DC | PRN
  Administered 2013-10-24 (×2): 10 mg via INTRAVENOUS

## 2013-10-24 MED ORDER — SODIUM CHLORIDE 0.9 % IR SOLN
Status: DC | PRN
  Administered 2013-10-24: 1000 mL

## 2013-10-24 SURGICAL SUPPLY — 74 items
BAG SPEC THK2 15X12 ZIP CLS (MISCELLANEOUS) ×3
BAG ZIPLOCK 12X15 (MISCELLANEOUS) ×15 IMPLANT
BIOPATCH BLUE 3/4IN DISK W/1.5 (GAUZE/BANDAGES/DRESSINGS) ×5 IMPLANT
BLADE SURG SZ10 CARB STEEL (BLADE) ×5 IMPLANT
CANISTER SUCTION 2500CC (MISCELLANEOUS) ×5 IMPLANT
CHLORAPREP W/TINT 26ML (MISCELLANEOUS) ×5 IMPLANT
CLIP SUT LAPRA TY ABSORB (SUTURE) ×5 IMPLANT
COVER PROBE U/S 5X48 (MISCELLANEOUS) ×10 IMPLANT
DERMABOND ADVANCED (GAUZE/BANDAGES/DRESSINGS) ×4
DERMABOND ADVANCED .7 DNX12 (GAUZE/BANDAGES/DRESSINGS) ×6 IMPLANT
DRAIN CHANNEL RND F F (WOUND CARE) ×5 IMPLANT
DRAPE CAMERA CLOSED 9X96 (DRAPES) ×5 IMPLANT
DRAPE LAPAROSCOPIC ABDOMINAL (DRAPES) ×5 IMPLANT
DRAPE UTILITY XL STRL (DRAPES) ×5 IMPLANT
DRSG TEGADERM 4X4.75 (GAUZE/BANDAGES/DRESSINGS) ×5 IMPLANT
DURAPREP 26ML APPLICATOR (WOUND CARE) ×5 IMPLANT
ELECT REM PT RETURN 9FT ADLT (ELECTROSURGICAL) ×5
ELECTRODE REM PT RTRN 9FT ADLT (ELECTROSURGICAL) ×3 IMPLANT
EVACUATOR SILICONE 100CC (DRAIN) ×5 IMPLANT
GLOVE BIOGEL PI IND STRL 7.0 (GLOVE) ×3 IMPLANT
GLOVE BIOGEL PI INDICATOR 7.0 (GLOVE) ×2
GLOVE SURG SIGNA 7.5 PF LTX (GLOVE) ×45 IMPLANT
GOWN SPEC L4 XLG W/TWL (GOWN DISPOSABLE) ×10 IMPLANT
GOWN STRL REUS W/ TWL XL LVL3 (GOWN DISPOSABLE) ×9 IMPLANT
GOWN STRL REUS W/TWL LRG LVL3 (GOWN DISPOSABLE) ×15 IMPLANT
GOWN STRL REUS W/TWL XL LVL3 (GOWN DISPOSABLE) ×6
KIT BASIN OR (CUSTOM PROCEDURE TRAY) ×10 IMPLANT
LIGASURE IMPACT 36 18CM CVD LR (INSTRUMENTS) ×5 IMPLANT
MANIFOLD NEPTUNE II (INSTRUMENTS) ×5 IMPLANT
NS IRRIG 1000ML POUR BTL (IV SOLUTION) ×20 IMPLANT
PACK CARDIOVASCULAR III (CUSTOM PROCEDURE TRAY) ×5 IMPLANT
PACK GENERAL/GYN (CUSTOM PROCEDURE TRAY) ×5 IMPLANT
PAD ABD 8X10 STRL (GAUZE/BANDAGES/DRESSINGS) ×5 IMPLANT
PAD TELFA 2X3 NADH STRL (GAUZE/BANDAGES/DRESSINGS) ×10 IMPLANT
POUCH OSTOMY 2 3/4  H 3804 (WOUND CARE) ×2
POUCH OSTOMY 2 3/4 H 3804 (WOUND CARE) ×1
POUCH OSTOMY 2 PC DRNBL 2.75 (WOUND CARE) ×3 IMPLANT
SCISSORS LAP 5X35 DISP (ENDOMECHANICALS) ×5 IMPLANT
SEPRAFILM PROCEDURAL PACK 3X5 (MISCELLANEOUS) ×5 IMPLANT
SET IRRIG TUBING LAPAROSCOPIC (IRRIGATION / IRRIGATOR) ×5 IMPLANT
SLEEVE XCEL OPT CAN 5 100 (ENDOMECHANICALS) ×5 IMPLANT
SOL PREP PROV IODINE SCRUB 4OZ (MISCELLANEOUS) ×5 IMPLANT
SOLUTION ANTI FOG 6CC (MISCELLANEOUS) ×5 IMPLANT
SPONGE DRAIN TRACH 4X4 STRL 2S (GAUZE/BANDAGES/DRESSINGS) ×5 IMPLANT
SPONGE GAUZE 4X4 12PLY (GAUZE/BANDAGES/DRESSINGS) ×5 IMPLANT
SPONGE LAP 18X18 X RAY DECT (DISPOSABLE) ×15 IMPLANT
STAPLER PROXIMATE 75MM BLUE (STAPLE) ×5 IMPLANT
STAPLER VISISTAT 35W (STAPLE) ×10 IMPLANT
SUCTION POOLE TIP (SUCTIONS) ×5 IMPLANT
SUT ETHILON 2 0 PS N (SUTURE) ×5 IMPLANT
SUT MNCRL AB 4-0 PS2 18 (SUTURE) ×5 IMPLANT
SUT NOVA 1 T20/GS 25DT (SUTURE) ×35 IMPLANT
SUT PROLENE 0 SH 30 (SUTURE) ×5 IMPLANT
SUT SILK 2 0 (SUTURE) ×2
SUT SILK 2-0 18XBRD TIE 12 (SUTURE) ×3 IMPLANT
SUT VIC AB 3-0 SH 18 (SUTURE) ×10 IMPLANT
SUT VIC AB 3-0 SH 8-18 (SUTURE) ×5 IMPLANT
SUT VIC AB 5-0 PS2 18 (SUTURE) ×5 IMPLANT
SYR 50ML LL SCALE MARK (SYRINGE) ×5 IMPLANT
TAPE CLOTH SURG 4X10 WHT LF (GAUZE/BANDAGES/DRESSINGS) ×5 IMPLANT
TOWEL OR 17X26 10 PK STRL BLUE (TOWEL DISPOSABLE) ×15 IMPLANT
TOWEL OR NON WOVEN STRL DISP B (DISPOSABLE) ×10 IMPLANT
TRAY FOLEY CATH 14FRSI W/METER (CATHETERS) ×5 IMPLANT
TRAY LAP CHOLE (CUSTOM PROCEDURE TRAY) ×5 IMPLANT
TROCAR ADV FIXATION 12X100MM (TROCAR) ×5 IMPLANT
TROCAR BLADELESS OPT 5 100 (ENDOMECHANICALS) ×5 IMPLANT
TROCAR XCEL BLUNT TIP 100MML (ENDOMECHANICALS) ×10 IMPLANT
TROCAR XCEL NON-BLD 11X100MML (ENDOMECHANICALS) ×5 IMPLANT
TROCAR XCEL UNIV SLVE 11M 100M (ENDOMECHANICALS) ×5 IMPLANT
TUBING INSUFFLATION 10FT LAP (TUBING) ×5 IMPLANT
WATER STERILE IRR 1500ML POUR (IV SOLUTION) ×5 IMPLANT
WATER STERILE IRR 500ML POUR (IV SOLUTION) ×5 IMPLANT
YANKAUER SUCT BULB TIP 10FT TU (MISCELLANEOUS) ×5 IMPLANT
YANKAUER SUCT BULB TIP NO VENT (SUCTIONS) ×5 IMPLANT

## 2013-10-24 NOTE — Anesthesia Preprocedure Evaluation (Addendum)
Anesthesia Evaluation  Patient identified by MRN, date of birth, ID band Patient awake    Reviewed: Allergy & Precautions, H&P , NPO status , Patient's Chart, lab work & pertinent test results  Airway Mallampati: III TM Distance: >3 FB Neck ROM: Full    Dental no notable dental hx. (+) Teeth Intact   Pulmonary pneumonia -, resolved, former smoker,  Pulmonary nodule noted on CT scan. S/P decortication of right lung 2013 due to empyema breath sounds clear to auscultation  Pulmonary exam normal       Cardiovascular hypertension, Pt. 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 Hyperlipidemia  Renal/GU   negative genitourinary   Musculoskeletal  (+) Arthritis -, Osteoarthritis,    Abdominal (+) + obese,   Peds  Hematology negative hematology ROS (+)   Anesthesia Other Findings   Reproductive/Obstetrics negative OB ROS                       Anesthesia Physical Anesthesia Plan  ASA: III and emergent  Anesthesia Plan: General   Post-op Pain Management:    Induction: Intravenous, Cricoid pressure planned and Rapid sequence  Airway Management Planned: Oral ETT  Additional Equipment:   Intra-op Plan:   Post-operative Plan: Extubation in OR  Informed Consent: I have reviewed the patients History and Physical, chart, labs and discussed the procedure including the risks, benefits and alternatives for the proposed anesthesia with the patient or authorized representative who has indicated  his/her understanding and acceptance.   Dental advisory given  Plan Discussed with: Anesthesiologist, CRNA and Surgeon  Anesthesia Plan Comments:         Anesthesia Quick Evaluation

## 2013-10-24 NOTE — Progress Notes (Signed)
Patient ID: Carol Jackson, female   DOB: 06-18-1945, 68 y.o.   MRN: [id number] [facility] Surgery Progress Note:   3 Days Post-Op  Subjective: Patient rather miserable, having nausea, belching and bloating.  C/O GERD type symptoms.   Objective: Vital signs in last 24 hours: Temp:  [97.6 F (36.4 C)-98.3 F (36.8 C)] 97.6 F (36.4 C) (06/07 0454) Pulse Rate:  [59-67] 59 (06/07 0454) Resp:  [16-18] 16 (06/07 0454) BP: (148-156)/(83-89) 156/89 mmHg (06/07 0454) SpO2:  [92 %-93 %] 92 % (06/07 0454)  Intake/Output from previous day: 06/06 0701 - 06/07 0700 In: 3437.5 [P.O.:350; I.V.:2687.5; IV Piggyback:400] Out: 6468 [Urine:1125; Drains:30] Intake/Output this shift:   Physical Exam: Work of breathing is normal; Distended, slight diffuse tenderness to palpation.  Incisions ok  Lab Results:  Results for orders placed during the hospital encounter of 10/19/13 (from the past 48 hour(s))  LIPASE, BLOOD     Status: None   Collection Time    10/22/13 10:24 AM      Result Value Ref Range   Lipase 12  11 - 59 U/L  COMPREHENSIVE METABOLIC PANEL     Status: Abnormal   Collection Time    10/22/13 10:24 AM      Result Value Ref Range   Sodium 140  137 - 147 mEq/L   Potassium 3.2 (*) 3.7 - 5.3 mEq/L   Chloride 100  96 - 112 mEq/L   CO2 27  19 - 32 mEq/L   Glucose, Bld 126 (*) 70 - 99 mg/dL   BUN 7  6 - 23 mg/dL   Creatinine, Ser 0.52  0.50 - 1.10 mg/dL   Calcium 8.7  8.4 - 10.5 mg/dL   Total Protein 6.9  6.0 - 8.3 g/dL   Albumin 2.9 (*) 3.5 - 5.2 g/dL   AST 32  0 - 37 U/L   ALT 94 (*) 0 - 35 U/L   Alkaline Phosphatase 48  39 - 117 U/L   Total Bilirubin 0.4  0.3 - 1.2 mg/dL   GFR calc non Af Amer >90  >90 mL/min   GFR calc Af Amer >90  >90 mL/min   Comment: (NOTE)     The eGFR has been calculated using the CKD EPI equation.     This calculation has not been validated in all clinical situations.     eGFR's persistently <90 mL/min signify possible Chronic Kidney   Disease.  GLUCOSE, CAPILLARY     Status: Abnormal   Collection Time    10/22/13 12:10 PM      Result Value Ref Range   Glucose-Capillary 151 (*) 70 - 99 mg/dL  GLUCOSE, CAPILLARY     Status: Abnormal   Collection Time    10/22/13  3:49 PM      Result Value Ref Range   Glucose-Capillary 111 (*) 70 - 99 mg/dL  GLUCOSE, CAPILLARY     Status: Abnormal   Collection Time    10/22/13  7:53 PM      Result Value Ref Range   Glucose-Capillary 151 (*) 70 - 99 mg/dL  GLUCOSE, CAPILLARY     Status: Abnormal   Collection Time    10/23/13 12:24 AM      Result Value Ref Range   Glucose-Capillary 125 (*) 70 - 99 mg/dL  GLUCOSE, CAPILLARY     Status: Abnormal   Collection Time    10/23/13  3:49 AM      Result Value Ref Range   Glucose-Capillary  143 (*) 70 - 99 mg/dL  GLUCOSE, CAPILLARY     Status: Abnormal   Collection Time    10/23/13  7:08 AM      Result Value Ref Range   Glucose-Capillary 136 (*) 70 - 99 mg/dL  GLUCOSE, CAPILLARY     Status: Abnormal   Collection Time    10/23/13 12:09 PM      Result Value Ref Range   Glucose-Capillary 116 (*) 70 - 99 mg/dL  GLUCOSE, CAPILLARY     Status: Abnormal   Collection Time    10/23/13  4:01 PM      Result Value Ref Range   Glucose-Capillary 104 (*) 70 - 99 mg/dL  GLUCOSE, CAPILLARY     Status: Abnormal   Collection Time    10/23/13  8:32 PM      Result Value Ref Range   Glucose-Capillary 148 (*) 70 - 99 mg/dL  GLUCOSE, CAPILLARY     Status: Abnormal   Collection Time    10/24/13 12:12 AM      Result Value Ref Range   Glucose-Capillary 135 (*) 70 - 99 mg/dL  BASIC METABOLIC PANEL     Status: Abnormal   Collection Time    10/24/13  5:26 AM      Result Value Ref Range   Sodium 136 (*) 137 - 147 mEq/L   Potassium 3.9  3.7 - 5.3 mEq/L   Comment: DELTA CHECK NOTED   Chloride 99  96 - 112 mEq/L   CO2 23  19 - 32 mEq/L   Glucose, Bld 133 (*) 70 - 99 mg/dL   BUN 10  6 - 23 mg/dL   Creatinine, Ser 0.79  0.50 - 1.10 mg/dL   Comment:  DELTA CHECK NOTED   Calcium 8.5  8.4 - 10.5 mg/dL   GFR calc non Af Amer 85 (*) >90 mL/min   GFR calc Af Amer >90  >90 mL/min   Comment: (NOTE)     The eGFR has been calculated using the CKD EPI equation.     This calculation has not been validated in all clinical situations.     eGFR's persistently <90 mL/min signify possible Chronic Kidney     Disease.  CBC WITH DIFFERENTIAL     Status: Abnormal   Collection Time    10/24/13  5:26 AM      Result Value Ref Range   WBC 16.7 (*) 4.0 - 10.5 K/uL   RBC 5.46 (*) 3.87 - 5.11 MIL/uL   Hemoglobin 15.9 (*) 12.0 - 15.0 g/dL   HCT 46.6 (*) 36.0 - 46.0 %   MCV 85.3  78.0 - 100.0 fL   MCH 29.1  26.0 - 34.0 pg   MCHC 34.1  30.0 - 36.0 g/dL   RDW 15.4  11.5 - 15.5 %   Platelets 317  150 - 400 K/uL   Neutrophils Relative % 61  43 - 77 %   Neutro Abs 10.2 (*) 1.7 - 7.7 K/uL   Lymphocytes Relative 22  12 - 46 %   Lymphs Abs 3.6  0.7 - 4.0 K/uL   Monocytes Relative 17 (*) 3 - 12 %   Monocytes Absolute 2.8 (*) 0.1 - 1.0 K/uL   Eosinophils Relative 0  0 - 5 %   Eosinophils Absolute 0.1  0.0 - 0.7 K/uL   Basophils Relative 0  0 - 1 %   Basophils Absolute 0.0  0.0 - 0.1 K/uL  GLUCOSE, CAPILLARY  Status: Abnormal   Collection Time    10/24/13  7:20 AM      Result Value Ref Range   Glucose-Capillary 134 (*) 70 - 99 mg/dL    Radiology/Results: Ct Abdomen Pelvis W Contrast  10/22/2013   CLINICAL DATA:  Postop gastric bypass with prior distal pancreatectomy. Vomiting. Assess for abdominal wall hernia. Evaluate for leak.  EXAM: CT ABDOMEN AND PELVIS WITH CONTRAST  TECHNIQUE: Multidetector CT imaging of the abdomen and pelvis was performed using the standard protocol following bolus administration of intravenous contrast.  CONTRAST:  158m OMNIPAQUE IOHEXOL 300 MG/ML  SOLN  COMPARISON:  UGI 10/20/2013 and PET-CT 04/14/2012  FINDINGS: Lung bases demonstrate a small left pleural effusion. There is mild consolidation over the posterior left lower lobe  likely atelectasis. Pleural-based 6 mm nodule over the left lower lobe adjacent the hemidiaphragm. Minimal dependent atelectasis posterior right base. Small hiatal hernia.  Abdominal images demonstrate a surgical drain entering the left lateral mid abdominal wall extending to lie with its tip lying adjacent the lesser curvature of the stomach. There is a surgical suture line over the stomach compatible patient's recent gastric bypass. The region of the gastrojejunostomy anastomosis is unremarkable. Surgical clips are present over the left upper quadrant compatible with recent partial pancreatectomy. There is a surgical suture line over several small bowel loops in the left mid abdomen. There is mild air within the soft tissues of the abdominal wall compatible patient's recent surgery.  There is mild diffuse hepatic steatosis. Visualized portion of the body and head of the pancreas is within normal. The gallbladder, right adrenal gland and kidneys are within normal. There is a 1.7 cm left adrenal nodule unchanged. 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 Appendix is normal. There is no free peritoneal air or focal inflammatory change. There is a tiny amount of free fluid over the right pericolic gutter inferior to the liver. There is minimal calcified plaque over the abdominal aorta and iliac arteries. There is minimal prominence of the at ascending and transverse colon with abrupt transition to a decompressed descending and rectosigmoid colon in the left upper quadrant as cannot completely exclude a early/partially obstructing process of the colon in the left upper quadrant.  Pelvic images demonstrate multiple phleboliths. Stable 2.3 cm cyst along the left side of the vaginal wall there are degenerative changes of the spine.  IMPRESSION: Multiple postoperative changes compatible patient's recent gastric bypass procedure and distal pancreatectomy and splenectomy. The gastric jejunal anastomosis is unremarkable. Surgical  drain in place with tip along the lesser curvature of the stomach.  Mild prominence of the ascending hand transverse colon to the region of the splenic flexure in the left upper quadrant with there is an abrupt transition to a decompressed descending colon/rectosigmoid colon. Cannot exclude a early/partial colonic obstructive process in this region possibly secondary to adhesions. Recommend serial follow-up abdominal films as clinically indicated.  Hepatic steatosis.  Small hiatal hernia.  Small left pleural effusion with bibasilar atelectasis.  6 mm subpleural nodule over the right base. Recommend followup chest CT in 6 months.  This recommendation follows the consensus statement: Guidelines for Management of Small Pulmonary Nodules Detected on CT Scans: A Statement from the [place] published in Radiology 2005; 237:395-400. Online at: [email or web address]/  Small left adrenal nodule unchanged likely an adenoma.   Electronically Signed   By: DMarin OlpM.D.   On: 10/22/2013 13:35    Anti-infectives: Anti-infectives   Start     Dose/Rate Route Frequency  Ordered Stop   10/19/13 0600  cefOXitin (MEFOXIN) 2 g in dextrose 5 % 50 mL IVPB     2 g 100 mL/hr over 30 Minutes Intravenous On call to O.R. 10/19/13 0534 10/19/13 1154      Assessment/Plan: Problem List: Patient Active Problem List   Diagnosis Date Noted  . 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 S/P gastric bypass 10/19/2013  . Diabetes 09/29/2013  . Thoracotomy scar of right chest-decortication at [facility] 09/29/2013  . Post-pancreatectomy diabetes-splenectomy at [facility] 09/29/2013  . 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 Obesity, unspecified 10/22/2012    CT scan showed transition in colon and this was noted when looking for LT during surgery.  Seemed more posterior but otherwise normal.    AXR shows continued distention of the ascending and  transverse colon with no air in the descending colon or rectum. Cecum ~12cm  WBC elevated to 16 today.   After a long discussion with Dr Hassell Done, Dr Lucia Gaskins and Dr Excell Seltzer, we decided to take the patient back to the operating room due to colonic obstruction and concern for impending perforation.  We discussed this with the patient and her husband in detail.  We discussed the probably need for an open incision but that we would try laparoscopically at first.  We discussed the possible need for bowel resection and resection of her splenic flexure.  This would most likely entail an ostomy.  We discussed the need for possible ICU after surgery.  We discussed that she will most likely be in the hospital for another week.  Risks of surgery include bleeding, infection, damage to adjacent structures and need for additional procedures or surgery.  The patient understands these risks and is in agreement with [place] that the risk outweighs the benefits of surgery.  3 Days Post-Op    LOS: 5 days   Rosario Adie, MD  Colorectal and [place] [facility]   10/24/2013 9:43 AM

## 2013-10-24 NOTE — Anesthesia Postprocedure Evaluation (Signed)
  Anesthesia Post-op Note  Patient: Carol Jackson  Procedure(s) Performed: Procedure(s): LAPAROSCOPY DIAGNOSTIC, EXPLORATORY LAPAROTOMY, LEFT TRANSVERSE COLON RESECTION WITH COLOSTOMY, UPPER ENDOSCOPY (N/A) INSERTION CENTRAL LINE ADULT (Right)  Patient Location: PACU  Anesthesia Type:General  Level of Consciousness: awake, alert  and oriented  Airway and Oxygen Therapy: Patient Spontanous Breathing and Patient connected to face mask oxygen  Post-op Pain: none  Post-op Assessment: Post-op Vital signs reviewed, Patient's Cardiovascular Status Stable, Respiratory Function Stable, Patent Airway, No signs of Nausea or vomiting and Pain level controlled. Right IJ CVP line inserted at request of surgeon. Portable CXR done in PACU showed CVP tip in good position. No PTX.  Post-op Vital Signs: Reviewed and stable  Last Vitals:  Filed Vitals:   10/24/13 1610  BP:   Pulse: 60  Temp:   Resp: 12    Complications: No apparent anesthesia complications

## 2013-10-24 NOTE — Progress Notes (Signed)
Pt had small amount of emesis at beginning of shift (2000) liquid brown, medicated x2 this shift for nausea.  Denies flatus and only faint BS noted.  Pt refusing to walk this shift and verbalizes understanding of rationale.  Old JP dressing site changed x2 this shift.  Became saturated after walking to the BR c serosanguinous fluid.  Abd is soft and slightly distended this AM.

## 2013-10-24 NOTE — Transfer of Care (Signed)
Immediate Anesthesia Transfer of Care Note  Patient: RAELENE TREW  Procedure(s) Performed: Procedure(s) (LRB): LAPAROSCOPY DIAGNOSTIC, EXPLORATORY LAPAROTOMY, LEFT TRANSVERSE COLON RESECTION WITH COLOSTOMY, UPPER ENDOSCOPY (N/A) INSERTION CENTRAL LINE ADULT (Right)  Patient Location: PACU  Anesthesia Type: General  Level of Consciousness: sedated, patient cooperative and responds to stimulation  Airway & Oxygen Therapy: Patient Spontanous Breathing and Patient connected to face mask oxgen  Post-op Assessment: Report given to PACU RN and Post -op Vital signs reviewed and stable  Post vital signs: Reviewed and stable  Complications: No apparent anesthesia complications

## 2013-10-24 NOTE — Anesthesia Procedure Notes (Signed)
Procedures

## 2013-10-24 NOTE — Brief Op Note (Signed)
10/24/2013  2:32 PM  PATIENT:  Carol Jackson, 68 y.o., female, MRN: [id number]  PREOP DIAGNOSIS:  Colonic obstruction at splenic flexure, recent laparoscopic Roux en Y gastric bypass.  POSTOP DIAGNOSIS:   Colonic obstruction at splenic flexure secondary to stapled colon, normal post op appearing Roux en Y gastric bypass  PROCEDURE:   Procedure(s): LAPAROSCOPY DIAGNOSTIC, EXPLORATORY LAPAROTOMY, LEFT TRANSVERSE COLON RESECTION WITH Right transverse end COLOSTOMY, Hartmann's pouch, UPPER ENDOSCOPY [drawing in chart of surgery]  SURGEON:   Alphonsa Overall, M.D.  ASSISTANT:   A. Marcello Moores, M.D.  ANESTHESIA:   general  Anesthesiologist: Venia Carbon. Royce Macadamia, MD CRNA: Montel Clock, CRNA  General  EBL:  400  ml  BLOOD ADMINISTERED: none  DRAINS: 62 F Blake in the LUQ  LOCAL MEDICATIONS USED:   None  SPECIMEN:   Left transverse colon  COUNTS CORRECT:  YES  INDICATIONS FOR PROCEDURE:  Carol Jackson is a 68 y.o. (DOB: [birth year]) white  female whose primary care physician is ESCAJEDA, Delfino Lovett, MD and comes for abdominal exploration for apparent colonic obstruction at the splenic flexure.  [name] had a RYGB on 10/19/2013 by Dr. Rockne Coons.   The indications and risks of the surgery were explained to the patient.  I also spoke to her husband.  The risks include, but are not limited to, infection, bleeding, probable open surgery, possible colostomy, and nerve injury.  Note dictated to:   [phone]

## 2013-10-25 ENCOUNTER — Encounter (HOSPITAL_COMMUNITY): Payer: Self-pay | Admitting: Surgery

## 2013-10-25 LAB — URINALYSIS, ROUTINE W REFLEX MICROSCOPIC
Glucose, UA: NEGATIVE mg/dL
Hgb urine dipstick: NEGATIVE
Ketones, ur: NEGATIVE mg/dL
Nitrite: NEGATIVE
PROTEIN: NEGATIVE mg/dL
Specific Gravity, Urine: 1.027 (ref 1.005–1.030)
UROBILINOGEN UA: 0.2 mg/dL (ref 0.0–1.0)
pH: 5 (ref 5.0–8.0)

## 2013-10-25 LAB — CBC
HCT: 45.6 % (ref 36.0–46.0)
Hemoglobin: 15.1 g/dL — ABNORMAL HIGH (ref 12.0–15.0)
MCH: 28.7 pg (ref 26.0–34.0)
MCHC: 33.1 g/dL (ref 30.0–36.0)
MCV: 86.5 fL (ref 78.0–100.0)
Platelets: 272 10*3/uL (ref 150–400)
RBC: 5.27 MIL/uL — ABNORMAL HIGH (ref 3.87–5.11)
RDW: 16 % — ABNORMAL HIGH (ref 11.5–15.5)
WBC: 17.2 10*3/uL — ABNORMAL HIGH (ref 4.0–10.5)

## 2013-10-25 LAB — GLUCOSE, CAPILLARY
GLUCOSE-CAPILLARY: 90 mg/dL (ref 70–99)
GLUCOSE-CAPILLARY: 124 mg/dL — AB (ref 70–99)
Glucose-Capillary: 103 mg/dL — ABNORMAL HIGH (ref 70–99)
Glucose-Capillary: 115 mg/dL — ABNORMAL HIGH (ref 70–99)
Glucose-Capillary: 123 mg/dL — ABNORMAL HIGH (ref 70–99)
Glucose-Capillary: 124 mg/dL — ABNORMAL HIGH (ref 70–99)
Glucose-Capillary: 87 mg/dL (ref 70–99)

## 2013-10-25 LAB — URINE MICROSCOPIC-ADD ON

## 2013-10-25 LAB — BASIC METABOLIC PANEL
BUN: 22 mg/dL (ref 6–23)
CHLORIDE: 103 meq/L (ref 96–112)
CO2: 21 meq/L (ref 19–32)
Calcium: 7.1 mg/dL — ABNORMAL LOW (ref 8.4–10.5)
Creatinine, Ser: 1.82 mg/dL — ABNORMAL HIGH (ref 0.50–1.10)
GFR calc Af Amer: 32 mL/min — ABNORMAL LOW (ref >90)
GFR calc non Af Amer: 28 mL/min — ABNORMAL LOW (ref >90)
Glucose, Bld: 103 mg/dL — ABNORMAL HIGH (ref 70–99)
POTASSIUM: 4.1 meq/L (ref 3.7–5.3)
SODIUM: 138 meq/L (ref 137–147)

## 2013-10-25 MED ORDER — SODIUM CHLORIDE 0.9 % IV BOLUS (SEPSIS)
1000.0000 mL | Freq: Once | INTRAVENOUS | Status: AC
  Administered 2013-10-25: 1000 mL via INTRAVENOUS

## 2013-10-25 MED ORDER — DEXTROSE-NACL 5-0.45 % IV SOLN
INTRAVENOUS | Status: DC
  Administered 2013-10-25 – 2013-10-26 (×2): via INTRAVENOUS

## 2013-10-25 MED ORDER — FUROSEMIDE 10 MG/ML IJ SOLN
20.0000 mg | Freq: Once | INTRAMUSCULAR | Status: AC
  Administered 2013-10-25: 20 mg via INTRAVENOUS

## 2013-10-25 MED ORDER — FUROSEMIDE 10 MG/ML IJ SOLN
20.0000 mg | Freq: Once | INTRAMUSCULAR | Status: AC
  Administered 2013-10-25: 20 mg via INTRAVENOUS
  Filled 2013-10-25: qty 2

## 2013-10-25 MED ORDER — FUROSEMIDE 10 MG/ML IJ SOLN
INTRAMUSCULAR | Status: AC
  Filled 2013-10-25: qty 2

## 2013-10-25 MED ORDER — SODIUM CHLORIDE 0.9 % IV BOLUS (SEPSIS)
1000.0000 mL | Freq: Once | INTRAVENOUS | Status: AC
  Administered 2013-10-24: 1000 mL via INTRAVENOUS

## 2013-10-25 MED ORDER — FUROSEMIDE 10 MG/ML IJ SOLN
120.0000 mg | Freq: Two times a day (BID) | INTRAVENOUS | Status: DC
  Administered 2013-10-25 – 2013-10-27 (×4): 120 mg via INTRAVENOUS
  Filled 2013-10-25 (×5): qty 12

## 2013-10-25 NOTE — Op Note (Signed)
NAMEBINDU, DOCTER           ACCOUNT NO.:  [id number]  MEDICAL RECORD NO.:  [id number]  LOCATION:  4166                         FACILITY:  [facility]  PHYSICIAN:  Fenton Malling. Lucia Gaskins, M.D.  DATE OF BIRTH:  01-Jul-1945  DATE OF PROCEDURE:  10/24/2013                              OPERATIVE REPORT  PREOPERATIVE DIAGNOSIS:  Colonic obstruction at splenic flexure, postop Roux-en-Y gastric bypass.  POSTOPERATIVE DIAGNOSIS:  Colonic obstruction at splenic flexure secondary to stapled colon, normal postop appearing Roux-en-Y gastric bypass.  PROCEDURE:  Diagnostic laparoscopy, converted to exploratory laparotomy with left transverse colon resection, right transverse end colostomy, Hartmann's pouch, upper endoscopy.  SURGEON:  Alphonsa Overall, MD  FIRST ASSISTANT:  Jonita Albee, MD  ANESTHESIA:  General endotracheal.  BLOOD LOSS:  400 mL.  DRAINS LEFT:  There was a 19-French Blake drain in the left upper quadrant.  SPECIMEN:  A left transverse colon with a long suture mark in the proximal end.  INDICATION FOR PROCEDURE:  Ms. Hellmer is a 68 year old white female who is a patient of Dr. Rubie Maid and comes for abdominal exploration with apparent obstruction of her colon at the splenic flexure.  She underwent a Roux-en-Y gastric bypass by Dr. Johnathan Hausen on October 19, 2013.  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  The indications and potential complications of surgery were explained to the patient and her husband.  Potential risks include, but are not limited to, infection, bleeding, probable open surgery, possible colostomy, and nerve injury.  OPERATIVE NOTE:  Ms. Steinmiller was taken to room #1 at [facility] where she underwent a general endotracheal anesthesia supervised by Dr. Josephine Igo.  She was given 2 g of cefotetan.  She has had a  Foley catheter in place.  Her abdomen was prepped with ChloraPrep and sterilely draped.  She had the Dermabond on the skin that I took off and just wiped off before her prep.  A time-out was held and surgical checklist was run.    I first accessed her abdomen using prior trocars where Dr. Hassell Done had placed.  I was able get my finger into the abdominal cavity through a left paramedian trocar which I think was where the prior scope had been placed and a right paramedian incision.  I placed a 12 mm Ethicon trocar and a 12 mm balloon trocar at the two port sites.  I used the 0 and 30 degree angled laparoscope, but the patient had such  significant colon dilatation, I could not see any reasonable anatomy.  At laparoscopy, there was no obvious reason for the colonic obstruction.  I thought she would be best served with an open laparotomy.  The laparoscopic trocars were removed.  I made an upper midline incision.  Carried this below the umbilicus and I went to the midline into the abdominal cavity.  Right and left lobes of the liver were unremarkable.  She had massively distended colon which made intraabdominal exploration on her difficult.  She has some free fluid in her pelvis which was aspirated.  There  was no obvious infection or pus identified.  First, we identified the gastrojejunostomy which I could see that was somewhat difficult.  Then I tried to palpate up along her where the colon went, to the left upper quadrant, but because of the distended bowel, there really was no room to do this.  It became obvious that I would not be able to do an adequate exploration with her colon distended.  The next step was I decompressed the colon through a mid transverse colon enterotomy.  I put the sucker in and sucked out mainly air, some loose stool, and closed this with a 2-0 silk suture.  After the colon had been decompressed, it was possible to evaluate her abdomen much better.  I identified her left colon and  traced this up to under the diaphragm, but I felt like she had a staple line across her colon at the splenic flexure, which was adhered under the diaphragm.  I dissected from the left transverse colon towards the splenic flexure of the colon.  After some difficulty, I was able to mobilize this transverse colon where it was completely transected with a staple line.  Because this was stuck up high under the diaphragm, I never directly saw the splenic flexure.  The divided splenic flexure colon attached to the gastric remnant.  I divided the splenic flexure from the gastric remnant.  I resected the left transverse colon to the divided splenic flexure.  I was careful because the jejunal limb of the gastrojejunostomy went antecolic, but we had carefully divided the mesentery of the left transverse colon with a ligasure.  Eventually I was able to pull the transverse colon around the jejunal limb and took this off.  I did divide the mesentery primarily with LigaSure, but I did use some 2-0 silk sutures.  She had some omentum that came with the specimen.  I thought that I had spared the gastric remnant which I re-examined.  I put 2 silk sutures in the serosa of the gastric remnant.  I then thought I had a right transverse colon that I can bring up was an ostomy.    I tried to get the distal stapled splenic flexure/left colon down, but it was stuck to the diaphragm.  I was concerned about damaging or injuring the new gastric pouch and gastrojejunostomy and could not see any benefit in being overly aggressive.  I did place a 2-0 Prolene in the upper left colon.  I though this would help identify the colon, but the Prolene is probably 6 cm or more from the divided end of the colon.  I could feel hard stool balls going down the left colon.  We irrigated out the left upper quadrant with 4 L of saline.  I brought the right transverse colon through the right rectus muscle midway between the umbilicus and the right  subcostal margin.  I used a single 3-0 Vicryl suture intra-abdominally to tack up the ostomy. I then brought the right transverse colon out through the right upper quadrant.  I then closed the abdomen.  I put Seprafilm down below the wound.  I closed the abdomen with interrupted #1 Novafil sutures.  She had minimal contamination except for opening the colon to decompress it.  I irrigated the wound out thoroughly, then loosely closed with staples.  I put Telfa wicks between the staples with the idea if there is any problem with the wound we take those Telfa wicks out quickly.  I then matured  the colostomy in the right upper quadrant using interrupted 3-0 Vicryl sutures.  This created about one and three quarter inch ostomy which I put a bag over.  Ostomy was placed right at the edge of the prior lap-band port.  I put a little bit of glue to kind of hold that skin together as a single 5-0 Vicryl suture to hold the skin together.  I stapled the other skin trocar that I had opened.  She tolerated the procedure well.  Sponge and needle count were correct at the end of the case.  Dr. Royce Macadamia was going to place a central line  postoperatively.  She is going to be transferred to the ICU for recovery.   Fenton Malling. Lucia Gaskins, M.D., FACS   DHN/MEDQ  D:  10/24/2013  T:  10/24/2013  Job:  [id number]  cc:   Isabel Caprice Hassell Done, [place] [address], [address] [place] [address]  Rubie Maid, MD Fax: [phone]

## 2013-10-25 NOTE — Progress Notes (Signed)
Patient ID: Carol Jackson, female   DOB: 1945-07-11, 68 y.o.   MRN: [id number] [facility] Surgery Progress Note:   1 Day Post-Op  Subjective: Mental status is clear.  No complaints Objective: Vital signs in last 24 hours: Temp:  [97 F (36.1 C)-97.9 F (36.6 C)] 97.5 F (36.4 C) (06/08 1200) Pulse Rate:  [58-96] 96 (06/08 1400) Resp:  [8-24] 15 (06/08 1400) BP: (80-135)/(53-87) 114/64 mmHg (06/08 1400) SpO2:  [90 %-98 %] 94 % (06/08 1400) Weight:  [254 lb 3.1 oz (115.3 kg)-257 lb 15 oz (117 kg)] 257 lb 15 oz (117 kg) (06/08 0441)  Intake/Output from previous day: 06/07 0701 - 06/08 0700 In: 6514.2 [P.O.:135; I.V.:5379.2; IV Piggyback:1000] Out: 1075 [Urine:430; Drains:170; Blood:475] Intake/Output this shift: Total I/O In: 1230 [P.O.:30; I.V.:1200] Out: 30 [Urine:30]  Physical Exam: Work of breathing is a little increased.  JP serosanguinous.  Ostomy pink  Lab Results:  Results for orders placed during the hospital encounter of 10/19/13 (from the past 48 hour(s))  GLUCOSE, CAPILLARY     Status: Abnormal   Collection Time    10/23/13  4:01 PM      Result Value Ref Range   Glucose-Capillary 104 (*) 70 - 99 mg/dL  GLUCOSE, CAPILLARY     Status: Abnormal   Collection Time    10/23/13  8:32 PM      Result Value Ref Range   Glucose-Capillary 148 (*) 70 - 99 mg/dL  GLUCOSE, CAPILLARY     Status: Abnormal   Collection Time    10/24/13 12:12 AM      Result Value Ref Range   Glucose-Capillary 135 (*) 70 - 99 mg/dL  GLUCOSE, CAPILLARY     Status: Abnormal   Collection Time    10/24/13  3:58 AM      Result Value Ref Range   Glucose-Capillary 123 (*) 70 - 99 mg/dL  BASIC METABOLIC PANEL     Status: Abnormal   Collection Time    10/24/13  5:26 AM      Result Value Ref Range   Sodium 136 (*) 137 - 147 mEq/L   Potassium 3.9  3.7 - 5.3 mEq/L   Comment: DELTA CHECK NOTED   Chloride 99  96 - 112 mEq/L   CO2 23  19 - 32 mEq/L   Glucose, Bld 133 (*) 70 - 99 mg/dL    BUN 10  6 - 23 mg/dL   Creatinine, Ser 0.79  0.50 - 1.10 mg/dL   Comment: DELTA CHECK NOTED   Calcium 8.5  8.4 - 10.5 mg/dL   GFR calc non Af Amer 85 (*) >90 mL/min   GFR calc Af Amer >90  >90 mL/min   Comment: (NOTE)     The eGFR has been calculated using the CKD EPI equation.     This calculation has not been validated in all clinical situations.     eGFR's persistently <90 mL/min signify possible Chronic Kidney     Disease.  CBC WITH DIFFERENTIAL     Status: Abnormal   Collection Time    10/24/13  5:26 AM      Result Value Ref Range   WBC 16.7 (*) 4.0 - 10.5 K/uL   RBC 5.46 (*) 3.87 - 5.11 MIL/uL   Hemoglobin 15.9 (*) 12.0 - 15.0 g/dL   HCT 46.6 (*) 36.0 - 46.0 %   MCV 85.3  78.0 - 100.0 fL   MCH 29.1  26.0 - 34.0 pg   MCHC 34.1  30.0 - 36.0 g/dL   RDW 15.4  11.5 - 15.5 %   Platelets 317  150 - 400 K/uL   Neutrophils Relative % 61  43 - 77 %   Neutro Abs 10.2 (*) 1.7 - 7.7 K/uL   Lymphocytes Relative 22  12 - 46 %   Lymphs Abs 3.6  0.7 - 4.0 K/uL   Monocytes Relative 17 (*) 3 - 12 %   Monocytes Absolute 2.8 (*) 0.1 - 1.0 K/uL   Eosinophils Relative 0  0 - 5 %   Eosinophils Absolute 0.1  0.0 - 0.7 K/uL   Basophils Relative 0  0 - 1 %   Basophils Absolute 0.0  0.0 - 0.1 K/uL  GLUCOSE, CAPILLARY     Status: Abnormal   Collection Time    10/24/13  7:20 AM      Result Value Ref Range   Glucose-Capillary 134 (*) 70 - 99 mg/dL  GLUCOSE, CAPILLARY     Status: Abnormal   Collection Time    10/24/13 10:01 AM      Result Value Ref Range   Glucose-Capillary 136 (*) 70 - 99 mg/dL  GLUCOSE, CAPILLARY     Status: Abnormal   Collection Time    10/24/13 12:12 PM      Result Value Ref Range   Glucose-Capillary 107 (*) 70 - 99 mg/dL  GLUCOSE, CAPILLARY     Status: Abnormal   Collection Time    10/24/13  3:28 PM      Result Value Ref Range   Glucose-Capillary 129 (*) 70 - 99 mg/dL   Comment 1 Documented in Chart    GLUCOSE, CAPILLARY     Status: Abnormal   Collection Time     10/24/13  8:03 PM      Result Value Ref Range   Glucose-Capillary 113 (*) 70 - 99 mg/dL   Comment 1 Documented in Chart     Comment 2 Notify RN    GLUCOSE, CAPILLARY     Status: Abnormal   Collection Time    10/24/13 11:38 PM      Result Value Ref Range   Glucose-Capillary 135 (*) 70 - 99 mg/dL   Comment 1 Documented in Chart     Comment 2 Notify RN    GLUCOSE, CAPILLARY     Status: Abnormal   Collection Time    10/25/13  3:33 AM      Result Value Ref Range   Glucose-Capillary 115 (*) 70 - 99 mg/dL   Comment 1 Documented in Chart     Comment 2 Notify RN    CBC     Status: Abnormal   Collection Time    10/25/13  6:40 AM      Result Value Ref Range   WBC 17.2 (*) 4.0 - 10.5 K/uL   RBC 5.27 (*) 3.87 - 5.11 MIL/uL   Hemoglobin 15.1 (*) 12.0 - 15.0 g/dL   HCT 45.6  36.0 - 46.0 %   MCV 86.5  78.0 - 100.0 fL   MCH 28.7  26.0 - 34.0 pg   MCHC 33.1  30.0 - 36.0 g/dL   RDW 16.0 (*) 11.5 - 15.5 %   Platelets 272  150 - 400 K/uL  BASIC METABOLIC PANEL     Status: Abnormal   Collection Time    10/25/13  6:40 AM      Result Value Ref Range   Sodium 138  137 - 147 mEq/L   Potassium 4.1  3.7 - 5.3 mEq/L   Chloride 103  96 - 112 mEq/L   CO2 21  19 - 32 mEq/L   Glucose, Bld 103 (*) 70 - 99 mg/dL   BUN 22  6 - 23 mg/dL   Comment: DELTA CHECK NOTED     REPEATED TO VERIFY   Creatinine, Ser 1.82 (*) 0.50 - 1.10 mg/dL   Comment: DELTA CHECK NOTED     REPEATED TO VERIFY   Calcium 7.1 (*) 8.4 - 10.5 mg/dL   GFR calc non Af Amer 28 (*) >90 mL/min   GFR calc Af Amer 32 (*) >90 mL/min   Comment: (NOTE)     The eGFR has been calculated using the CKD EPI equation.     This calculation has not been validated in all clinical situations.     eGFR's persistently <90 mL/min signify possible Chronic Kidney     Disease.  GLUCOSE, CAPILLARY     Status: None   Collection Time    10/25/13  7:49 AM      Result Value Ref Range   Glucose-Capillary 87  70 - 99 mg/dL   Comment 1 Documented in Chart      Comment 2 Notify RN    GLUCOSE, CAPILLARY     Status: Abnormal   Collection Time    10/25/13 11:55 AM      Result Value Ref Range   Glucose-Capillary 103 (*) 70 - 99 mg/dL   Comment 1 Documented in Chart     Comment 2 Notify RN      Radiology/Results: Dg Chest Port 1 View  10/24/2013   CLINICAL DATA:  central catheter placement  EXAM: PORTABLE CHEST - 1 VIEW  COMPARISON:  November 26, 2012  FINDINGS: Central catheter tip is in the superior vena cava near the cavoatrial junction. No pneumothorax. There is no edema or consolidation. There is mild right base atelectasis. Heart is upper normal in size with normal pulmonary vascularity. No adenopathy. No bone lesions.  IMPRESSION: Central catheter tip in superior vena cava near the cavoatrial junction. No pneumothorax. Mild right base atelectasis. Lungs otherwise clear.   Electronically Signed   By: Lowella Grip M.D.   On: 10/24/2013 15:36   Dg Abd 2 Views  10/24/2013   CLINICAL DATA:  Colonic obstruction.  EXAM: ABDOMEN - 2 VIEW  COMPARISON:  CT of the abdomen and pelvis 10/22/2013.  FINDINGS: Extensive gaseous distention of the colon, which abruptly terminates at the level of the splenic flexure. Distal colon and rectum done are not confidently. Cecum measures up to 12.4 cm. Multiple nondilated loops of gas-filled small bowel are seen in the lower abdomen. No pneumoperitoneum. Multiple surgical clips are seen throughout the abdomen. Multiple pelvic phleboliths.  IMPRESSION: 1. Abnormal bowel gas pattern concerning for colonic obstruction. This abruptly terminates at the level of the splenic flexure immediately adjacent to sutures associated with the gastric bypass procedure. Given the appearance on the recent CT examination, the possibility of tethering of the colon in this region resulting and obstruction warrants consideration. Further evaluation with water-soluble contrast enema is suggested if there is clinical concern for colonic obstruction.    Electronically Signed   By: Vinnie Langton M.D.   On: 10/24/2013 11:16    Anti-infectives: Anti-infectives   Start     Dose/Rate Route Frequency Ordered Stop   10/19/13 0600  cefOXitin (MEFOXIN) 2 g in dextrose 5 % 50 mL IVPB     2 g 100 mL/hr over 30 Minutes  Intravenous On call to O.R. 10/19/13 0534 10/19/13 1154      Assessment/Plan: Problem List: Patient Active Problem List   Diagnosis Date Noted  . 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 S/P gastric bypass 10/19/2013  . Diabetes 09/29/2013  . Thoracotomy scar of right chest-decortication at [facility] 09/29/2013  . 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 Obesity, unspecified 10/22/2012    Urine output marginal . Cr up a bit.  Will give Lasix IV.  Get up out of bed.  1 Day Post-Op    LOS: 6 days   Matt B. Hassell Done, MD, [facility] [phone] beeper [phone]  10/25/2013 3:26 PM

## 2013-10-25 NOTE — Progress Notes (Signed)
CARE MANAGEMENT NOTE 10/25/2013  Patient:  Carol Jackson,Carol Jackson   Account Number:  [id number]  Date Initiated:  10/25/2013  Documentation initiated by:  DAVIS,RHONDA  Subjective/Objective Assessment:   pt with complication from lap band had intial surg to removed and lap roux en y done on 06022015/pt required second surg due to colonic obstruction on 06072015-colstomy with formation of a hartman pouch/hypotensive post op and placed in icu.     Action/Plan:   tbd based on progression s/p second surgical intervention.   Anticipated DC Date:  10/28/2013   Anticipated DC Plan:  HOME/SELF CARE  In-house referral  NA      DC Planning Services  NA      [facility] Choice  NA   Choice offered to / List presented to:  NA   DME arranged  NA      DME agency  NA     [place] arranged  NA      Faith agency  NA   Status of service:  In process, will continue to follow Medicare Important Message given?  NA - LOS <3 / Initial given by admissions (If response is "NO", the following Medicare IM given date fields will be blank) Date Medicare IM given:   Date Additional Medicare IM given:    Discharge Disposition:    Per UR Regulation:  Reviewed for med. necessity/level of care/duration of stay  If discussed at [place] of Stay Meetings, dates discussed:    Comments:  [id number]/Rhonda Eldridge Dace, [place], [place] [phone] Chart Reviewed for discharge and hospital needs. Discharge needs at time of review: None present will follow for needs. Review of patient progress due on [id number].

## 2013-10-25 NOTE — Consult Note (Signed)
WOC ostomy follow up Stoma type/location: RUQ, end transverse colostomy Stomal assessment/size: 1 3/4" round, only slightly budded, mucosa is dark but not necrotic Peristomal assessment: intact Treatment options for stomal/peristomal skin: none Output bloody Ostomy pouching: 2pc. 2 3/4" used today in order to allow staff to better assess the stoma characteristics Education provided:  Colostomy booklet left in the room, basic explanation of ostomy creation and pouch care. She is in a lot of discomfort today and not able to participate in the pouch change.   [place] team will follow along with you for support and teaching related to ostomy care.  Dezzie Badilla [place] RN,CWOCN [phone]

## 2013-10-26 LAB — RENAL FUNCTION PANEL
Albumin: 1.8 g/dL — ABNORMAL LOW (ref 3.5–5.2)
BUN: 37 mg/dL — AB (ref 6–23)
CHLORIDE: 97 meq/L (ref 96–112)
CO2: 18 mEq/L — ABNORMAL LOW (ref 19–32)
CREATININE: 2.82 mg/dL — AB (ref 0.50–1.10)
Calcium: 7.1 mg/dL — ABNORMAL LOW (ref 8.4–10.5)
GFR calc Af Amer: 19 mL/min — ABNORMAL LOW (ref >90)
GFR calc non Af Amer: 16 mL/min — ABNORMAL LOW (ref >90)
Glucose, Bld: 96 mg/dL (ref 70–99)
Phosphorus: 5.3 mg/dL — ABNORMAL HIGH (ref 2.3–4.6)
Potassium: 4.2 mEq/L (ref 3.7–5.3)
Sodium: 129 mEq/L — ABNORMAL LOW (ref 137–147)

## 2013-10-26 LAB — GLUCOSE, CAPILLARY
GLUCOSE-CAPILLARY: 83 mg/dL (ref 70–99)
GLUCOSE-CAPILLARY: 81 mg/dL (ref 70–99)
GLUCOSE-CAPILLARY: 83 mg/dL (ref 70–99)
Glucose-Capillary: 93 mg/dL (ref 70–99)
Glucose-Capillary: 103 mg/dL — ABNORMAL HIGH (ref 70–99)
Glucose-Capillary: 149 mg/dL — ABNORMAL HIGH (ref 70–99)
Glucose-Capillary: 59 mg/dL — ABNORMAL LOW (ref 70–99)

## 2013-10-26 LAB — CBC WITH DIFFERENTIAL/PLATELET
Basophils Absolute: 0 10*3/uL (ref 0.0–0.1)
Basophils Relative: 0 % (ref 0–1)
Eosinophils Absolute: 0.1 10*3/uL (ref 0.0–0.7)
Eosinophils Relative: 1 % (ref 0–5)
HCT: 38.3 % (ref 36.0–46.0)
Hemoglobin: 12.8 g/dL (ref 12.0–15.0)
LYMPHS PCT: 20 % (ref 12–46)
Lymphs Abs: 2.4 10*3/uL (ref 0.7–4.0)
MCH: 28.2 pg (ref 26.0–34.0)
MCHC: 33.4 g/dL (ref 30.0–36.0)
MCV: 84.4 fL (ref 78.0–100.0)
MONOS PCT: 15 % — AB (ref 3–12)
Monocytes Absolute: 1.8 10*3/uL — ABNORMAL HIGH (ref 0.1–1.0)
Neutro Abs: 7.8 10*3/uL — ABNORMAL HIGH (ref 1.7–7.7)
Neutrophils Relative %: 64 % (ref 43–77)
PLATELETS: 290 10*3/uL (ref 150–400)
RBC: 4.54 MIL/uL (ref 3.87–5.11)
RDW: 16.1 % — AB (ref 11.5–15.5)
WBC: 12.1 10*3/uL — AB (ref 4.0–10.5)

## 2013-10-26 LAB — SODIUM, URINE, RANDOM: SODIUM UR: 21 meq/L

## 2013-10-26 LAB — CREATININE, URINE, RANDOM: Creatinine, Urine: 174.5 mg/dL

## 2013-10-26 MED ORDER — SODIUM CHLORIDE 0.9 % IV SOLN
INTRAVENOUS | Status: DC
  Administered 2013-10-26 – 2013-10-28 (×4): via INTRAVENOUS

## 2013-10-26 MED ORDER — DEXTROSE 50 % IV SOLN
INTRAVENOUS | Status: AC
  Administered 2013-10-26: 25 mL
  Filled 2013-10-26: qty 50

## 2013-10-26 NOTE — Op Note (Signed)
Name:  Carol Jackson MRN: [id number] Date of Surgery: 10/26/2013  Preop Diagnosis:  Morbid Obesity, S/P RYGB, colon obstruction  Postop Diagnosis:  Morbid Obesity, S/P RYGB, staple injury to colono  Procedure:  Upper endoscopy  (Intraoperative)  Surgeon:  Alphonsa Overall, M.D.  Anesthesia:  GET  Indications for procedure: Carol Jackson is a 68 y.o. female whose primary care physician is Rubie Maid, MD and has completed a Roux-en-Y gastric bypass 10/19/2013 by Dr. Hassell Done.  The patient has been found to have a colon injury secondary to staples and is undergoing a second operation.  I have done an open operation to resect the left transverse colon.  During the operation, I have done a lot of manipulation of the previous Roux en Y gastric bypass.   I am doing an intraoperative upper endoscopy to evaluate the gastric pouch and the gastro-jejunal anastomosis.  Operative Note: The patient is under general anesthesia.  Dr. Joyice Faster is looking at the anastomosis of the patient while I do an upper endoscopy to evaluate the stomach pouch and gastrojejunal anastomosis.  With the patient intubated, I passed the Pentax endoscope without difficulty down the esophagus.  The mucosa of the stomach looked viable and the staple line was intact without bleeding.  The gastro-jejunal anastomosis looked okay and what I would think it would look like at 5 days from surgery..  While I insufflated the stomach pouch with air, Dr. Marcello Moores clamped off the efferent limb of the jejunum.  She then flooded the upper abdomen with saline to put the gastric pouch and gastro-jejunal anastomosis under saline.  There was no bubbling or evidence of a leak.    The scope was then withdrawn.  The esophagus was unremarkable and the patient tolerated the endoscopy without difficulty.  Alphonsa Overall, MD, [place] [facility] Pager: [phone] Office phone:  [phone]

## 2013-10-26 NOTE — Progress Notes (Addendum)
MD Hassell Done at bedside and advised patient can have occasional popsicles but does not want to advance to clear liquid diet nor meal trays at this time. MD Hassell Done also advised to hold protein powder for now.

## 2013-10-26 NOTE — Progress Notes (Signed)
Patient ID: Carol Jackson, female   DOB: 12-04-1945, 68 y.o.   MRN: [id number] [facility] Progress Note:   2 Days Post-Op  Subjective: Mental status is clear;   Objective: Vital signs in last 24 hours: Temp:  [97.5 F (36.4 C)-98.5 F (36.9 C)] 98.5 F (36.9 C) (06/09 0800) Pulse Rate:  [84-99] 87 (06/09 1000) Resp:  [10-25] 16 (06/09 1000) BP: (84-136)/(51-84) 136/73 mmHg (06/09 1000) SpO2:  [92 %-98 %] 97 % (06/09 1000) Weight:  [269 lb 13.5 oz (122.4 kg)] 269 lb 13.5 oz (122.4 kg) (06/09 0000)  Intake/Output from previous day: 06/08 0701 - 06/09 0700 In: 2409 [P.O.:30; I.V.:2255; IV Piggyback:124] Out: 325 [Urine:240; Drains:55; Stool:30] Intake/Output this shift: Total I/O In: 225 [I.V.:225] Out: 230 [Urine:230]  Physical Exam: Work of breathing is slightly increased but no different from yesterday;  Ostomy viable  Lab Results:  Results for orders placed during the hospital encounter of 10/19/13 (from the past 48 hour(s))  GLUCOSE, CAPILLARY     Status: Abnormal   Collection Time    10/24/13 12:12 PM      Result Value Ref Range   Glucose-Capillary 107 (*) 70 - 99 mg/dL  GLUCOSE, CAPILLARY     Status: Abnormal   Collection Time    10/24/13  3:28 PM      Result Value Ref Range   Glucose-Capillary 129 (*) 70 - 99 mg/dL   Comment 1 Documented in Chart    GLUCOSE, CAPILLARY     Status: Abnormal   Collection Time    10/24/13  8:03 PM      Result Value Ref Range   Glucose-Capillary 113 (*) 70 - 99 mg/dL   Comment 1 Documented in Chart     Comment 2 Notify RN    GLUCOSE, CAPILLARY     Status: Abnormal   Collection Time    10/24/13 11:38 PM      Result Value Ref Range   Glucose-Capillary 135 (*) 70 - 99 mg/dL   Comment 1 Documented in Chart     Comment 2 Notify RN    GLUCOSE, CAPILLARY     Status: Abnormal   Collection Time    10/25/13  3:33 AM      Result Value Ref Range   Glucose-Capillary 115 (*) 70 - 99 mg/dL   Comment 1 Documented in Chart      Comment 2 Notify RN    CBC     Status: Abnormal   Collection Time    10/25/13  6:40 AM      Result Value Ref Range   WBC 17.2 (*) 4.0 - 10.5 K/uL   RBC 5.27 (*) 3.87 - 5.11 MIL/uL   Hemoglobin 15.1 (*) 12.0 - 15.0 g/dL   HCT 45.6  36.0 - 46.0 %   MCV 86.5  78.0 - 100.0 fL   MCH 28.7  26.0 - 34.0 pg   MCHC 33.1  30.0 - 36.0 g/dL   RDW 16.0 (*) 11.5 - 15.5 %   Platelets 272  150 - 400 K/uL  BASIC METABOLIC PANEL     Status: Abnormal   Collection Time    10/25/13  6:40 AM      Result Value Ref Range   Sodium 138  137 - 147 mEq/L   Potassium 4.1  3.7 - 5.3 mEq/L   Chloride 103  96 - 112 mEq/L   CO2 21  19 - 32 mEq/L   Glucose, Bld 103 (*) 70 -  99 mg/dL   BUN 22  6 - 23 mg/dL   Comment: DELTA CHECK NOTED     REPEATED TO VERIFY   Creatinine, Ser 1.82 (*) 0.50 - 1.10 mg/dL   Comment: DELTA CHECK NOTED     REPEATED TO VERIFY   Calcium 7.1 (*) 8.4 - 10.5 mg/dL   GFR calc non Af Amer 28 (*) >90 mL/min   GFR calc Af Amer 32 (*) >90 mL/min   Comment: (NOTE)     The eGFR has been calculated using the CKD EPI equation.     This calculation has not been validated in all clinical situations.     eGFR's persistently <90 mL/min signify possible Chronic Kidney     Disease.  GLUCOSE, CAPILLARY     Status: None   Collection Time    10/25/13  7:49 AM      Result Value Ref Range   Glucose-Capillary 87  70 - 99 mg/dL   Comment 1 Documented in Chart     Comment 2 Notify RN    GLUCOSE, CAPILLARY     Status: Abnormal   Collection Time    10/25/13 11:55 AM      Result Value Ref Range   Glucose-Capillary 103 (*) 70 - 99 mg/dL   Comment 1 Documented in Chart     Comment 2 Notify RN    GLUCOSE, CAPILLARY     Status: None   Collection Time    10/25/13  3:59 PM      Result Value Ref Range   Glucose-Capillary 90  70 - 99 mg/dL   Comment 1 Documented in Chart     Comment 2 Notify RN    URINALYSIS, ROUTINE W REFLEX MICROSCOPIC     Status: Abnormal   Collection Time    10/25/13  5:27 PM       Result Value Ref Range   Color, Urine ORANGE (*) YELLOW   Comment: BIOCHEMICALS MAY BE AFFECTED BY COLOR   APPearance CLOUDY (*) CLEAR   Specific Gravity, Urine 1.027  1.005 - 1.030   pH 5.0  5.0 - 8.0   Glucose, UA NEGATIVE  NEGATIVE mg/dL   Hgb urine dipstick NEGATIVE  NEGATIVE   Bilirubin Urine MODERATE (*) NEGATIVE   Ketones, ur NEGATIVE  NEGATIVE mg/dL   Protein, ur NEGATIVE  NEGATIVE mg/dL   Urobilinogen, UA 0.2  0.0 - 1.0 mg/dL   Nitrite NEGATIVE  NEGATIVE   Leukocytes, UA MODERATE (*) NEGATIVE  SODIUM, URINE, RANDOM     Status: None   Collection Time    10/25/13  5:27 PM      Result Value Ref Range   Sodium, Ur 21     Comment: Performed at [facility], URINE, RANDOM     Status: None   Collection Time    10/25/13  5:27 PM      Result Value Ref Range   Creatinine, Urine 174.5     Comment: Performed at [facility]     Status: Abnormal   Collection Time    10/25/13  5:27 PM      Result Value Ref Range   Squamous Epithelial / LPF RARE  RARE   WBC, UA 21-50  <3 WBC/hpf   RBC / HPF 3-6  <3 RBC/hpf   Bacteria, UA MANY (*) RARE   Urine-Other FEW YEAST    GLUCOSE, CAPILLARY     Status: Abnormal   Collection Time  10/25/13  7:46 PM      Result Value Ref Range   Glucose-Capillary 124 (*) 70 - 99 mg/dL   Comment 1 Notify RN     Comment 2 Documented in Chart    GLUCOSE, CAPILLARY     Status: Abnormal   Collection Time    10/25/13 11:39 PM      Result Value Ref Range   Glucose-Capillary 124 (*) 70 - 99 mg/dL   Comment 1 Notify RN    GLUCOSE, CAPILLARY     Status: None   Collection Time    10/26/13  3:25 AM      Result Value Ref Range   Glucose-Capillary 93  70 - 99 mg/dL   Comment 1 Notify RN    CBC WITH DIFFERENTIAL     Status: Abnormal   Collection Time    10/26/13  5:28 AM      Result Value Ref Range   WBC 12.1 (*) 4.0 - 10.5 K/uL   RBC 4.54  3.87 - 5.11 MIL/uL   Hemoglobin 12.8  12.0 - 15.0 g/dL    HCT 38.3  36.0 - 46.0 %   MCV 84.4  78.0 - 100.0 fL   MCH 28.2  26.0 - 34.0 pg   MCHC 33.4  30.0 - 36.0 g/dL   RDW 16.1 (*) 11.5 - 15.5 %   Platelets 290  150 - 400 K/uL   Neutrophils Relative % 64  43 - 77 %   Lymphocytes Relative 20  12 - 46 %   Monocytes Relative 15 (*) 3 - 12 %   Eosinophils Relative 1  0 - 5 %   Basophils Relative 0  0 - 1 %   Neutro Abs 7.8 (*) 1.7 - 7.7 K/uL   Lymphs Abs 2.4  0.7 - 4.0 K/uL   Monocytes Absolute 1.8 (*) 0.1 - 1.0 K/uL   Eosinophils Absolute 0.1  0.0 - 0.7 K/uL   Basophils Absolute 0.0  0.0 - 0.1 K/uL   WBC Morphology VACUOLATED NEUTROPHILS     Smear Review LARGE PLATELETS PRESENT    RENAL FUNCTION PANEL     Status: Abnormal   Collection Time    10/26/13  5:28 AM      Result Value Ref Range   Sodium 129 (*) 137 - 147 mEq/L   Comment: DELTA CHECK NOTED     REPEATED TO VERIFY   Potassium 4.2  3.7 - 5.3 mEq/L   Chloride 97  96 - 112 mEq/L   CO2 18 (*) 19 - 32 mEq/L   Glucose, Bld 96  70 - 99 mg/dL   BUN 37 (*) 6 - 23 mg/dL   Comment: DELTA CHECK NOTED     REPEATED TO VERIFY   Creatinine, Ser 2.82 (*) 0.50 - 1.10 mg/dL   Comment: DELTA CHECK NOTED     REPEATED TO VERIFY   Calcium 7.1 (*) 8.4 - 10.5 mg/dL   Phosphorus 5.3 (*) 2.3 - 4.6 mg/dL   Albumin 1.8 (*) 3.5 - 5.2 g/dL   GFR calc non Af Amer 16 (*) >90 mL/min   GFR calc Af Amer 19 (*) >90 mL/min   Comment: (NOTE)     The eGFR has been calculated using the CKD EPI equation.     This calculation has not been validated in all clinical situations.     eGFR's persistently <90 mL/min signify possible Chronic Kidney     Disease.  GLUCOSE, CAPILLARY     Status: Abnormal  Collection Time    10/26/13  8:20 AM      Result Value Ref Range   Glucose-Capillary 103 (*) 70 - 99 mg/dL   Comment 1 Documented in Chart     Comment 2 Notify RN      Radiology/Results: Dg Chest Port 1 View  10/24/2013   CLINICAL DATA:  central catheter placement  EXAM: PORTABLE CHEST - 1 VIEW  COMPARISON:   November 26, 2012  FINDINGS: Central catheter tip is in the superior vena cava near the cavoatrial junction. No pneumothorax. There is no edema or consolidation. There is mild right base atelectasis. Heart is upper normal in size with normal pulmonary vascularity. No adenopathy. No bone lesions.  IMPRESSION: Central catheter tip in superior vena cava near the cavoatrial junction. No pneumothorax. Mild right base atelectasis. Lungs otherwise clear.   Electronically Signed   By: Lowella Grip M.D.   On: 10/24/2013 15:36    Anti-infectives: Anti-infectives   Start     Dose/Rate Route Frequency Ordered Stop   10/19/13 0600  cefOXitin (MEFOXIN) 2 g in dextrose 5 % 50 mL IVPB     2 g 100 mL/hr over 30 Minutes Intravenous On call to O.R. 10/19/13 0534 10/19/13 1154      Assessment/Plan: Problem List: Patient Active Problem List   Diagnosis Date Noted  . 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 S/P gastric bypass 10/19/2013  . Diabetes 09/29/2013  . Thoracotomy scar of right chest-decortication at [facility] 09/29/2013  . Post-pancreatectomy diabetes-splenectomy at [facility] 09/29/2013  . 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 Obesity, unspecified 10/22/2012    Appreciated Dr. Etheleen Nicks consult.  Will offer popsicles PO for now.   2 Days Post-Op    LOS: 7 days   Matt B. Hassell Done, MD, [facility] [phone] beeper [phone]  10/26/2013 11:01 AM

## 2013-10-26 NOTE — Consult Note (Signed)
Carol Jackson is an 67 y.o. female referred by Dr Martin   Chief Complaint: AKI HPI: 67yo admitted 10/19/13 and underwent Lap roux-en-y gastric bypass and removal of Lap band.  Subsequently developed colonic obstruction due to stapled colon and underwent Left transverse colon resection with colostomy on 10/24/13.  Pt had normal renal fx on 10/22/13 with Scr .5.  On 6/5 she had a contrasted CT abd/pelvis.  Next Scr 6/7 was .79 and on that day went to surgery.  She had some hypotension post op with SBP 80's-90's.  UO which was 2-3L/d decreased to 1.1L on 6/6-6/7 and then 430cc 6/7-6/8 and only 240cc past 24hr.  Nl Kidneys on CT.  Past Medical History  Diagnosis Date  . Diabetes mellitus without complication   . GERD (gastroesophageal reflux disease)   . Hyperlipidemia   . Hypertension   . Thyroid disease     hypothyroidism  . Morbid obesity   . Pneumonia     hxof 2013 had booster for pneumonia 09/2013   . Hypothyroidism   . Arthritis     back     Past Surgical History  Procedure Laterality Date  . Laparoscopic gastric banding  04/03/04  . Abdominal hysterectomy  01/15/00  . Bladder suspension  11/08/08  . 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 Pancreas surgery  09/24/11    tail end removed  . Lung surgery  05/06/12  . Back surgery  1982 adn 1985     discs removed lumbar area   . Colonoscopy  2014   . Gastric roux-en-y N/A 10/19/2013    Procedure: LAPAROSCOPIC ROUX-EN-Y GASTRIC BYPASS WITH UPPER ENDOSCOPY AND REMOVAL OF LAP BAND;  Surgeon: Matthew B Martin, MD;  Location: [facility];  Service: General;  Laterality: N/A;  . Laparoscopy N/A 10/24/2013    Procedure: LAPAROSCOPY DIAGNOSTIC, EXPLORATORY LAPAROTOMY, LEFT TRANSVERSE COLON RESECTION WITH COLOSTOMY, UPPER ENDOSCOPY;  Surgeon: David H Newman, MD;  Location: [facility];  Service: General;  Laterality: N/A;  . Central venous catheter insertion Right 10/24/2013    Procedure: INSERTION CENTRAL LINE ADULT;  Surgeon: David H Newman, MD;  Location: [facility];   Service: General;  Laterality: Right;    Family History  Problem Relation Age of Onset  . Heart disease Father    Social History:  reports that she quit smoking about 29 years ago. Her smoking use included Cigarettes. She smoked 0.00 packs per day. She has never used smokeless tobacco. She reports that she drinks alcohol. She reports that she does not use illicit drugs.  Allergies:  Allergies  Allergen Reactions  . Morphine And Related Hives  . Levaquin [Levofloxacin] Hives, Swelling and Rash    Medications Prior to Admission  Medication Sig Dispense Refill  . amLODipine (NORVASC) 10 MG tablet Take 10 mg by mouth every evening.       . Cholecalciferol (VITAMIN D-3) 1000 UNITS CAPS Take 1,000 Units by mouth daily.       . Cyanocobalamin (VITAMIN B-12) 2500 MCG SUBL Place 2,500 mcg under the tongue every evening.       . Fe Bisgly-Succ-C-Thre-B12-FA (IRON 21/7 PO) Take 27 mg by mouth daily.       . Krill Oil Omega-3 300 MG CAPS Take 300 mg by mouth daily.       . levothyroxine (SYNTHROID, LEVOTHROID) 50 MCG tablet Take 50 mcg by mouth daily before breakfast.      . loratadine (CLARITIN) 10 MG tablet Take 10 mg by mouth daily.      .   metFORMIN (GLUCOPHAGE) 500 MG tablet Take 500 mg by mouth 2 (two) times daily with a meal.      . omeprazole (PRILOSEC) 20 MG capsule Take 20 mg by mouth 2 (two) times daily before a meal.       . potassium chloride (KLOR-CON) 20 MEQ packet Take 20 mEq by mouth daily.       . simvastatin (ZOCOR) 40 MG tablet Take 40 mg by mouth every evening.      . acetaminophen-codeine (TYLENOL #3) 300-30 MG per tablet Take 1 tablet by mouth every 6 (six) hours as needed for moderate pain.      . aspirin 81 MG tablet Take 81 mg by mouth every evening.       . glucose blood test strip 1 each by Other route as needed for other. Use as instructed      . Lancets MISC by Does not apply route.         Lab Results: UA: no protein, 21-50 wbc, 3-6 rbc's   Recent Labs   10/24/13 0526 10/25/13 0640 10/26/13 0528  WBC 16.7* 17.2* 12.1*  HGB 15.9* 15.1* 12.8  HCT 46.6* 45.6 38.3  PLT 317 272 290   BMET  Recent Labs  10/24/13 0526 10/25/13 0640 10/26/13 0528  NA 136* 138 129*  K 3.9 4.1 4.2  CL 99 103 97  CO2 23 21 18*  GLUCOSE 133* 103* 96  BUN 10 22 37*  CREATININE 0.79 1.82* 2.82*  CALCIUM 8.5 7.1* 7.1*  PHOS  --   --  5.3*   LFT  Recent Labs  10/26/13 0528  ALBUMIN 1.8*   Dg Chest Port 1 View  10/24/2013   CLINICAL DATA:  central catheter placement  EXAM: PORTABLE CHEST - 1 VIEW  COMPARISON:  November 26, 2012  FINDINGS: Central catheter tip is in the superior vena cava near the cavoatrial junction. No pneumothorax. There is no edema or consolidation. There is mild right base atelectasis. Heart is upper normal in size with normal pulmonary vascularity. No adenopathy. No bone lesions.  IMPRESSION: Central catheter tip in superior vena cava near the cavoatrial junction. No pneumothorax. Mild right base atelectasis. Lungs otherwise clear.   Electronically Signed   By: William  Woodruff M.D.   On: 10/24/2013 15:36   Dg Abd 2 Views  10/24/2013   CLINICAL DATA:  Colonic obstruction.  EXAM: ABDOMEN - 2 VIEW  COMPARISON:  CT of the abdomen and pelvis 10/22/2013.  FINDINGS: Extensive gaseous distention of the colon, which abruptly terminates at the level of the splenic flexure. Distal colon and rectum done are not confidently. Cecum measures up to 12.4 cm. Multiple nondilated loops of gas-filled small bowel are seen in the lower abdomen. No pneumoperitoneum. Multiple surgical clips are seen throughout the abdomen. Multiple pelvic phleboliths.  IMPRESSION: 1. Abnormal bowel gas pattern concerning for colonic obstruction. This abruptly terminates at the level of the splenic flexure immediately adjacent to sutures associated with the gastric bypass procedure. Given the appearance on the recent CT examination, the possibility of tethering of the colon in this  region resulting and obstruction warrants consideration. Further evaluation with water-soluble contrast enema is suggested if there is clinical concern for colonic obstruction.   Electronically Signed   By: Daniel  Entrikin M.D.   On: 10/24/2013 11:16    ROS: No change in vision No SOB but pain with deep inspiration No CP + Abd pain No arthritic CO's No neuropathic CO's No dysuria  PHYSICAL EXAM:   Blood pressure 132/62, pulse 99, temperature 97.8 F (36.6 C), temperature source Oral, resp. rate 25, height 5' 6" (1.676 m), weight 122.4 kg (269 lb 13.5 oz), SpO2 92.00%. HEENT: PERRLA EOMI NECK:Rt IJ triple lumen LUNGS:Clear ant CARDIAC:RRR wo MRG TKP:TWSF BS Soft, diffuse tenderness around incision.  Rt UQ colostomy.  Wound with bandage EXT:No edema NEURO:CNI M&SI Ox3 No asterixis  Assessment: 1. AKI most likely due to ATN from hypotension and IV contrast 2. Mild hyponatremia 3.  Mild Met Acidosis (most likely) 4. SP Gastric bypass and lt trans colectomy with colostomy 5. Hypotension, improved PLAN: 1. Change IV fluids to NS and decrease rate as SNa is decreasing 2. Cont IV lasix.  UO does appear to be picking up 3. Await urine chemistries 4. Check urine culture 5. Low K diet when able to eat 6. Discussed the possibility of HD if renal fx cont to worsen.  I did tell her that this will get better, it is just a matter of when. 7. Avoid NSAID's 8. Daily Scr   Windy Kalata 10/26/2013, 6:58 AM

## 2013-10-27 LAB — BASIC METABOLIC PANEL
BUN: 45 mg/dL — ABNORMAL HIGH (ref 6–23)
CHLORIDE: 96 meq/L (ref 96–112)
CO2: 17 meq/L — AB (ref 19–32)
Calcium: 7.1 mg/dL — ABNORMAL LOW (ref 8.4–10.5)
Creatinine, Ser: 2.95 mg/dL — ABNORMAL HIGH (ref 0.50–1.10)
GFR calc Af Amer: 18 mL/min — ABNORMAL LOW (ref >90)
GFR calc non Af Amer: 16 mL/min — ABNORMAL LOW (ref >90)
Glucose, Bld: 79 mg/dL (ref 70–99)
POTASSIUM: 3.5 meq/L — AB (ref 3.7–5.3)
Sodium: 131 mEq/L — ABNORMAL LOW (ref 137–147)

## 2013-10-27 LAB — GLUCOSE, CAPILLARY
GLUCOSE-CAPILLARY: 81 mg/dL (ref 70–99)
GLUCOSE-CAPILLARY: 94 mg/dL (ref 70–99)
GLUCOSE-CAPILLARY: 94 mg/dL (ref 70–99)
Glucose-Capillary: 90 mg/dL (ref 70–99)
Glucose-Capillary: 84 mg/dL (ref 70–99)
Glucose-Capillary: 102 mg/dL — ABNORMAL HIGH (ref 70–99)

## 2013-10-27 MED ORDER — BIOTENE DRY MOUTH MT LIQD
15.0000 mL | Freq: Two times a day (BID) | OROMUCOSAL | Status: DC
  Administered 2013-10-27 – 2013-10-31 (×7): 15 mL via OROMUCOSAL

## 2013-10-27 MED ORDER — ALUM & MAG HYDROXIDE-SIMETH 200-200-20 MG/5ML PO SUSP: 30.0000 mL | Freq: Four times a day (QID) | ORAL | Status: DC | PRN

## 2013-10-27 MED ORDER — CHLORHEXIDINE GLUCONATE 0.12 % MT SOLN
15.0000 mL | Freq: Two times a day (BID) | OROMUCOSAL | Status: DC
  Administered 2013-10-27 – 2013-10-31 (×9): 15 mL via OROMUCOSAL
  Filled 2013-10-27 (×8): qty 15

## 2013-10-27 NOTE — Progress Notes (Signed)
S: Feels well, Able to take PO fluids O:BP 129/50  Pulse 95  Temp(Src) 97.8 F (36.6 C) (Oral)  Resp 24  Ht 5\' 6"  (1.676 m)  Wt 122.4 kg (269 lb 13.5 oz)  BMI 43.57 kg/m2  SpO2 97%  Intake/Output Summary (Last 24 hours) at 10/27/13 1239 Last data filed at 10/27/13 1200  Gross per 24 hour  Intake   2764 ml  Output   3445 ml  Net   -681 ml   Weight change:  GBT:DVVOH and alert CVS:RRR Resp:Clear ant Abd:+ BS. Brown liquid in ostomy Ext:0-tr edema NEURO:CNI M&SI OX3 no asterixis   . antiseptic oral rinse  15 mL Mouth Rinse q12n4p  . chlorhexidine  15 mL Mouth Rinse BID  . furosemide  120 mg Intravenous Q12H  . heparin subcutaneous  5,000 Units Subcutaneous 3 times per day  . insulin aspart  0-20 Units Subcutaneous 6 times per day  . pantoprazole (PROTONIX) IV  40 mg Intravenous Q12H   No results found. BMET    Component Value Date/Time   NA 131* 10/27/2013 0600   K 3.5* 10/27/2013 0600   CL 96 10/27/2013 0600   CO2 17* 10/27/2013 0600   GLUCOSE 79 10/27/2013 0600   BUN 45* 10/27/2013 0600   CREATININE 2.95* 10/27/2013 0600   CALCIUM 7.1* 10/27/2013 0600   GFRNONAA 16* 10/27/2013 0600   GFRAA 18* 10/27/2013 0600   CBC    Component Value Date/Time   WBC 12.1* 10/26/2013 0528   RBC 4.54 10/26/2013 0528   HGB 12.8 10/26/2013 0528   HCT 38.3 10/26/2013 0528   PLT 290 10/26/2013 0528   MCV 84.4 10/26/2013 0528   MCH 28.2 10/26/2013 0528   MCHC 33.4 10/26/2013 0528   RDW 16.1* 10/26/2013 0528   LYMPHSABS 2.4 10/26/2013 0528   MONOABS 1.8* 10/26/2013 0528   EOSABS 0.1 10/26/2013 0528   BASOSABS 0.0 10/26/2013 0528     Assessment: 1. AKI sec ATN from hypotension and contrast.  Scr appears to be leveling off and UO excellent.  Expect Scr to start trending down in next day or so 2. Hyponatremia improving 3. SP gastric bypass and Lt transverse colectomy with colostomy 4. Hypotension, resolved  Plan: 1. Decrease IV fluids to 50cc/hr 2. Hold lasix for now and use prn 3. Recheck Scr in  AM   Tawnee Clegg T

## 2013-10-27 NOTE — Progress Notes (Signed)
Patient ID: Carol Jackson, female   DOB: November 24, 1945, 68 y.o.   MRN: [id number] [facility] Progress Note:   3 Days Post-Op  Subjective: Mental status is clear.  Feeling better Objective: Vital signs in last 24 hours: Temp:  [97.9 F (36.6 C)-98.5 F (36.9 C)] 98.1 F (36.7 C) (06/10 0400) Pulse Rate:  [82-100] 82 (06/10 0800) Resp:  [9-32] 9 (06/10 0800) BP: (92-139)/(47-110) 102/47 mmHg (06/10 0800) SpO2:  [90 %-98 %] 93 % (06/10 0800)  Intake/Output from previous day: 06/09 0701 - 06/10 0700 In: 2689 [P.O.:840; I.V.:1725; IV Piggyback:124] Out: 2695 [Urine:2565; Drains:70; Stool:60] Intake/Output this shift: Total I/O In: 150 [I.V.:150] Out: 75 [Urine:370; Stool:50]  Physical Exam: Work of breathing is unchanged.  Lungs clear.  Ostomy with gas and small amount of brown liquid stool  Lab Results:  Results for orders placed during the hospital encounter of 10/19/13 (from the past 48 hour(s))  GLUCOSE, CAPILLARY     Status: Abnormal   Collection Time    10/25/13 11:55 AM      Result Value Ref Range   Glucose-Capillary 103 (*) 70 - 99 mg/dL   Comment 1 Documented in Chart     Comment 2 Notify RN    GLUCOSE, CAPILLARY     Status: None   Collection Time    10/25/13  3:59 PM      Result Value Ref Range   Glucose-Capillary 90  70 - 99 mg/dL   Comment 1 Documented in Chart     Comment 2 Notify RN    URINALYSIS, ROUTINE W REFLEX MICROSCOPIC     Status: Abnormal   Collection Time    10/25/13  5:27 PM      Result Value Ref Range   Color, Urine ORANGE (*) YELLOW   Comment: BIOCHEMICALS MAY BE AFFECTED BY COLOR   APPearance CLOUDY (*) CLEAR   Specific Gravity, Urine 1.027  1.005 - 1.030   pH 5.0  5.0 - 8.0   Glucose, UA NEGATIVE  NEGATIVE mg/dL   Hgb urine dipstick NEGATIVE  NEGATIVE   Bilirubin Urine MODERATE (*) NEGATIVE   Ketones, ur NEGATIVE  NEGATIVE mg/dL   Protein, ur NEGATIVE  NEGATIVE mg/dL   Urobilinogen, UA 0.2  0.0 - 1.0 mg/dL   Nitrite  NEGATIVE  NEGATIVE   Leukocytes, UA MODERATE (*) NEGATIVE  SODIUM, URINE, RANDOM     Status: None   Collection Time    10/25/13  5:27 PM      Result Value Ref Range   Sodium, Ur 21     Comment: Performed at [place], URINE, RANDOM     Status: None   Collection Time    10/25/13  5:27 PM      Result Value Ref Range   Creatinine, Urine 174.5     Comment: Performed at [place]     Status: Abnormal   Collection Time    10/25/13  5:27 PM      Result Value Ref Range   Squamous Epithelial / LPF RARE  RARE   WBC, UA 21-50  <3 WBC/hpf   RBC / HPF 3-6  <3 RBC/hpf   Bacteria, UA MANY (*) RARE   Urine-Other FEW YEAST    GLUCOSE, CAPILLARY     Status: Abnormal   Collection Time    10/25/13  7:46 PM      Result Value Ref Range   Glucose-Capillary 124 (*) 70 - 99  mg/dL   Comment 1 Notify RN     Comment 2 Documented in Chart    GLUCOSE, CAPILLARY     Status: Abnormal   Collection Time    10/25/13 11:39 PM      Result Value Ref Range   Glucose-Capillary 124 (*) 70 - 99 mg/dL   Comment 1 Notify RN    GLUCOSE, CAPILLARY     Status: None   Collection Time    10/26/13  3:25 AM      Result Value Ref Range   Glucose-Capillary 93  70 - 99 mg/dL   Comment 1 Notify RN    CBC WITH DIFFERENTIAL     Status: Abnormal   Collection Time    10/26/13  5:28 AM      Result Value Ref Range   WBC 12.1 (*) 4.0 - 10.5 K/uL   RBC 4.54  3.87 - 5.11 MIL/uL   Hemoglobin 12.8  12.0 - 15.0 g/dL   HCT 38.3  36.0 - 46.0 %   MCV 84.4  78.0 - 100.0 fL   MCH 28.2  26.0 - 34.0 pg   MCHC 33.4  30.0 - 36.0 g/dL   RDW 16.1 (*) 11.5 - 15.5 %   Platelets 290  150 - 400 K/uL   Neutrophils Relative % 64  43 - 77 %   Lymphocytes Relative 20  12 - 46 %   Monocytes Relative 15 (*) 3 - 12 %   Eosinophils Relative 1  0 - 5 %   Basophils Relative 0  0 - 1 %   Neutro Abs 7.8 (*) 1.7 - 7.7 K/uL   Lymphs Abs 2.4  0.7 - 4.0 K/uL   Monocytes Absolute 1.8 (*) 0.1 -  1.0 K/uL   Eosinophils Absolute 0.1  0.0 - 0.7 K/uL   Basophils Absolute 0.0  0.0 - 0.1 K/uL   WBC Morphology VACUOLATED NEUTROPHILS     Smear Review LARGE PLATELETS PRESENT    RENAL FUNCTION PANEL     Status: Abnormal   Collection Time    10/26/13  5:28 AM      Result Value Ref Range   Sodium 129 (*) 137 - 147 mEq/L   Comment: DELTA CHECK NOTED     REPEATED TO VERIFY   Potassium 4.2  3.7 - 5.3 mEq/L   Chloride 97  96 - 112 mEq/L   CO2 18 (*) 19 - 32 mEq/L   Glucose, Bld 96  70 - 99 mg/dL   BUN 37 (*) 6 - 23 mg/dL   Comment: DELTA CHECK NOTED     REPEATED TO VERIFY   Creatinine, Ser 2.82 (*) 0.50 - 1.10 mg/dL   Comment: DELTA CHECK NOTED     REPEATED TO VERIFY   Calcium 7.1 (*) 8.4 - 10.5 mg/dL   Phosphorus 5.3 (*) 2.3 - 4.6 mg/dL   Albumin 1.8 (*) 3.5 - 5.2 g/dL   GFR calc non Af Amer 16 (*) >90 mL/min   GFR calc Af Amer 19 (*) >90 mL/min   Comment: (NOTE)     The eGFR has been calculated using the CKD EPI equation.     This calculation has not been validated in all clinical situations.     eGFR's persistently <90 mL/min signify possible Chronic Kidney     Disease.  GLUCOSE, CAPILLARY     Status: Abnormal   Collection Time    10/26/13  8:20 AM      Result Value Ref Range  Glucose-Capillary 103 (*) 70 - 99 mg/dL   Comment 1 Documented in Chart     Comment 2 Notify RN    GLUCOSE, CAPILLARY     Status: Abnormal   Collection Time    10/26/13 12:45 PM      Result Value Ref Range   Glucose-Capillary 149 (*) 70 - 99 mg/dL   Comment 1 Documented in Chart     Comment 2 Notify RN    GLUCOSE, CAPILLARY     Status: Abnormal   Collection Time    10/26/13  3:26 PM      Result Value Ref Range   Glucose-Capillary 59 (*) 70 - 99 mg/dL   Comment 1 Documented in Chart     Comment 2 Notify RN    GLUCOSE, CAPILLARY     Status: None   Collection Time    10/26/13  4:01 PM      Result Value Ref Range   Glucose-Capillary 83  70 - 99 mg/dL  GLUCOSE, CAPILLARY     Status: None    Collection Time    10/26/13  7:30 PM      Result Value Ref Range   Glucose-Capillary 81  70 - 99 mg/dL  GLUCOSE, CAPILLARY     Status: None   Collection Time    10/26/13 11:17 PM      Result Value Ref Range   Glucose-Capillary 83  70 - 99 mg/dL   Comment 1 Notify RN    GLUCOSE, CAPILLARY     Status: None   Collection Time    10/27/13  3:44 AM      Result Value Ref Range   Glucose-Capillary 81  70 - 99 mg/dL  BASIC METABOLIC PANEL     Status: Abnormal   Collection Time    10/27/13  6:00 AM      Result Value Ref Range   Sodium 131 (*) 137 - 147 mEq/L   Potassium 3.5 (*) 3.7 - 5.3 mEq/L   Chloride 96  96 - 112 mEq/L   CO2 17 (*) 19 - 32 mEq/L   Glucose, Bld 79  70 - 99 mg/dL   BUN 45 (*) 6 - 23 mg/dL   Creatinine, Ser 2.95 (*) 0.50 - 1.10 mg/dL   Calcium 7.1 (*) 8.4 - 10.5 mg/dL   GFR calc non Af Amer 16 (*) >90 mL/min   GFR calc Af Amer 18 (*) >90 mL/min   Comment: (NOTE)     The eGFR has been calculated using the CKD EPI equation.     This calculation has not been validated in all clinical situations.     eGFR's persistently <90 mL/min signify possible Chronic Kidney     Disease.    Radiology/Results: No results found.  Anti-infectives: Anti-infectives   Start     Dose/Rate Route Frequency Ordered Stop   10/19/13 0600  cefOXitin (MEFOXIN) 2 g in dextrose 5 % 50 mL IVPB     2 g 100 mL/hr over 30 Minutes Intravenous On call to O.R. 10/19/13 0534 10/19/13 1154      Assessment/Plan: Problem List: Patient Active Problem List   Diagnosis Date Noted  . 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 S/P gastric bypass 10/19/2013  . Diabetes 09/29/2013  . Thoracotomy scar of right chest-decortication at [facility] 09/29/2013  . 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 Obesity, unspecified 10/22/2012  Incision with wics.  JP serosanguinous.  Creatine is below 3 but not coming  down yet.   3 Days Post-Op    LOS: 8 days   Jackson B. Hassell Done, MD, [facility] [phone] beeper [phone]  10/27/2013 8:38 AM

## 2013-10-27 NOTE — Consult Note (Signed)
WOC ostomy consult note Stoma type/location: RUQ, end transverse colostomy Stomal assessment/size: 1 5/8" round, slightly budded, necrotic around aspects of perimeter, but with high volume of liquid, brown stool.  Peristomal assessment: Intact, 2 cm incision line at 9 o'clock noted, no problems noted.  Treatment options for stomal/peristomal skin: barrier ring to increase protrusion of stoma. Output Liquid, brown stool. Ostomy pouching: /2pc. 2 1/4" system.  Added barrier ring for convexity.  Education provided:Patient participated in care today.  Was able to cut out the barrier, although difficult since she is left handed.  Is going to have family bring her left handed scissors from home. Applied the pouch to the barrier and completed the roll closure.   Patient indicates she has not reviewed the materials left but will review in the coming days. Is more optimistic about the colostomy now that she has participated in a pouch change.  [place] team will continue to follow and be available to patient, medical and nursing teams.  Domenic Moras RN BSN [place] Pager [phone]

## 2013-10-28 ENCOUNTER — Inpatient Hospital Stay (HOSPITAL_COMMUNITY): Payer: Medicare Other

## 2013-10-28 LAB — BASIC METABOLIC PANEL
BUN: 42 mg/dL — ABNORMAL HIGH (ref 6–23)
CO2: 20 meq/L (ref 19–32)
Calcium: 7.1 mg/dL — ABNORMAL LOW (ref 8.4–10.5)
Chloride: 98 mEq/L (ref 96–112)
Creatinine, Ser: 2.36 mg/dL — ABNORMAL HIGH (ref 0.50–1.10)
GFR calc non Af Amer: 20 mL/min — ABNORMAL LOW (ref >90)
GFR, EST AFRICAN AMERICAN: 24 mL/min — AB (ref >90)
GLUCOSE: 99 mg/dL (ref 70–99)
POTASSIUM: 2.9 meq/L — AB (ref 3.7–5.3)
Sodium: 136 mEq/L — ABNORMAL LOW (ref 137–147)

## 2013-10-28 LAB — CBC WITH DIFFERENTIAL/PLATELET
Basophils Absolute: 0 10*3/uL (ref 0.0–0.1)
Basophils Relative: 0 % (ref 0–1)
Eosinophils Absolute: 0.2 10*3/uL (ref 0.0–0.7)
Eosinophils Relative: 1 % (ref 0–5)
HCT: 32.8 % — ABNORMAL LOW (ref 36.0–46.0)
Hemoglobin: 11.4 g/dL — ABNORMAL LOW (ref 12.0–15.0)
Lymphocytes Relative: 11 % — ABNORMAL LOW (ref 12–46)
Lymphs Abs: 1.7 10*3/uL (ref 0.7–4.0)
MCH: 28.6 pg (ref 26.0–34.0)
MCHC: 34.8 g/dL (ref 30.0–36.0)
MCV: 82.4 fL (ref 78.0–100.0)
Monocytes Absolute: 2 10*3/uL — ABNORMAL HIGH (ref 0.1–1.0)
Monocytes Relative: 13 % — ABNORMAL HIGH (ref 3–12)
NEUTROS ABS: 11.3 10*3/uL — AB (ref 1.7–7.7)
NEUTROS PCT: 75 % (ref 43–77)
PLATELETS: 298 10*3/uL (ref 150–400)
RBC: 3.98 MIL/uL (ref 3.87–5.11)
RDW: 15.4 % (ref 11.5–15.5)
WBC: 15.2 10*3/uL — ABNORMAL HIGH (ref 4.0–10.5)

## 2013-10-28 LAB — URINE CULTURE

## 2013-10-28 LAB — GLUCOSE, CAPILLARY
GLUCOSE-CAPILLARY: 97 mg/dL (ref 70–99)
GLUCOSE-CAPILLARY: 108 mg/dL — AB (ref 70–99)
Glucose-Capillary: 81 mg/dL (ref 70–99)
Glucose-Capillary: 101 mg/dL — ABNORMAL HIGH (ref 70–99)
Glucose-Capillary: 91 mg/dL (ref 70–99)

## 2013-10-28 MED ORDER — DEXTROSE 5 % IV SOLN
1.0000 g | INTRAVENOUS | Status: DC
  Administered 2013-10-28 – 2013-11-08 (×11): 1 g via INTRAVENOUS
  Filled 2013-10-28 (×14): qty 10

## 2013-10-28 MED ORDER — POTASSIUM CHLORIDE 10 MEQ/50ML IV SOLN
10.0000 meq | INTRAVENOUS | Status: AC
  Administered 2013-10-28 (×4): 10 meq via INTRAVENOUS
  Filled 2013-10-28 (×4): qty 50

## 2013-10-28 NOTE — Progress Notes (Signed)
eLink Physician-Brief Progress Note Patient Name: Carol Jackson DOB: Sep 28, 1945 MRN: [id number]  Date of Service  10/28/2013   HPI/Events of Note   Urine grows Klieb, UA very concering infection 6/8 Has a foley and renal insuff   eICU Interventions  Consider start abx for urine, ceftriaxone Dc foley if able, noted crt , need strict I/O still If unable to dc foley = change Will call gen surgery to dscuss   Intervention Category Intermediate Interventions: Communication with other healthcare providers and/or family;Diagnostic test evaluation;Infection - evaluation and management  FEINSTEIN,DANIEL J. 10/28/2013, 6:00 PM

## 2013-10-28 NOTE — Progress Notes (Signed)
Patient ID: PAXTYN WISDOM, female   DOB: [birth year], 68 y.o.   MRN: [id number] [facility] Progress Note:   4 Days Post-Op  Subjective: Mental status is clear.  Sitting up in chair.  Says she is feeling better Objective: Vital signs in last 24 hours: Temp:  [97.8 F (36.6 C)-98.3 F (36.8 C)] 98.1 F (36.7 C) (06/11 0000) Pulse Rate:  [78-95] 78 (06/11 0600) Resp:  [8-24] 9 (06/11 0600) BP: (102-131)/(47-78) 119/47 mmHg (06/11 0600) SpO2:  [91 %-97 %] 94 % (06/11 0600)  Intake/Output from previous day: 06/10 0701 - 06/11 0700 In: 1819.2 [P.O.:400; I.V.:1419.2] Out: 5250 [Urine:4975; Stool:275] Intake/Output this shift:    Physical Exam: Work of breathing is not labored.  Ostomy with brown stool.  JP serous.  Incision wics removed.  Some staples removed and irrigated with hydrogen peroxide.  Wet-dry ordered.    Lab Results:  Results for orders placed during the hospital encounter of 10/19/13 (from the past 48 hour(s))  GLUCOSE, CAPILLARY     Status: Abnormal   Collection Time    10/26/13  8:20 AM      Result Value Ref Range   Glucose-Capillary 103 (*) 70 - 99 mg/dL   Comment 1 Documented in Chart     Comment 2 Notify RN    URINE CULTURE     Status: None   Collection Time    10/26/13 10:04 AM      Result Value Ref Range   Specimen Description URINE, CATHETERIZED     Special Requests none     Culture  Setup Time       Value: 10/26/2013 13:55     Performed at [facility] Count       Value: 5,000 COLONIES/ML     Performed at [facility]   Culture       Value: KLEBSIELLA PNEUMONIAE     Performed at [facility]   Report Status 10/28/2013 FINAL     Organism ID, Bacteria KLEBSIELLA PNEUMONIAE    GLUCOSE, CAPILLARY     Status: Abnormal   Collection Time    10/26/13 12:45 PM      Result Value Ref Range   Glucose-Capillary 149 (*) 70 - 99 mg/dL   Comment 1 Documented in Chart     Comment 2 Notify RN    GLUCOSE,  CAPILLARY     Status: Abnormal   Collection Time    10/26/13  3:26 PM      Result Value Ref Range   Glucose-Capillary 59 (*) 70 - 99 mg/dL   Comment 1 Documented in Chart     Comment 2 Notify RN    GLUCOSE, CAPILLARY     Status: None   Collection Time    10/26/13  4:01 PM      Result Value Ref Range   Glucose-Capillary 83  70 - 99 mg/dL  GLUCOSE, CAPILLARY     Status: None   Collection Time    10/26/13  7:30 PM      Result Value Ref Range   Glucose-Capillary 81  70 - 99 mg/dL  GLUCOSE, CAPILLARY     Status: None   Collection Time    10/26/13 11:17 PM      Result Value Ref Range   Glucose-Capillary 83  70 - 99 mg/dL   Comment 1 Notify RN    GLUCOSE, CAPILLARY     Status: None   Collection Time    10/27/13  3:44 AM      Result Value Ref Range   Glucose-Capillary 81  70 - 99 mg/dL  BASIC METABOLIC PANEL     Status: Abnormal   Collection Time    10/27/13  6:00 AM      Result Value Ref Range   Sodium 131 (*) 137 - 147 mEq/L   Potassium 3.5 (*) 3.7 - 5.3 mEq/L   Chloride 96  96 - 112 mEq/L   CO2 17 (*) 19 - 32 mEq/L   Glucose, Bld 79  70 - 99 mg/dL   BUN 45 (*) 6 - 23 mg/dL   Creatinine, Ser 2.95 (*) 0.50 - 1.10 mg/dL   Calcium 7.1 (*) 8.4 - 10.5 mg/dL   GFR calc non Af Amer 16 (*) >90 mL/min   GFR calc Af Amer 18 (*) >90 mL/min   Comment: (NOTE)     The eGFR has been calculated using the CKD EPI equation.     This calculation has not been validated in all clinical situations.     eGFR's persistently <90 mL/min signify possible Chronic Kidney     Disease.  GLUCOSE, CAPILLARY     Status: None   Collection Time    10/27/13  7:33 AM      Result Value Ref Range   Glucose-Capillary 94  70 - 99 mg/dL  GLUCOSE, CAPILLARY     Status: None   Collection Time    10/27/13 12:00 PM      Result Value Ref Range   Glucose-Capillary 90  70 - 99 mg/dL  GLUCOSE, CAPILLARY     Status: None   Collection Time    10/27/13  3:46 PM      Result Value Ref Range   Glucose-Capillary 84  70  - 99 mg/dL  GLUCOSE, CAPILLARY     Status: Abnormal   Collection Time    10/27/13  7:49 PM      Result Value Ref Range   Glucose-Capillary 102 (*) 70 - 99 mg/dL  GLUCOSE, CAPILLARY     Status: None   Collection Time    10/27/13 11:53 PM      Result Value Ref Range   Glucose-Capillary 94  70 - 99 mg/dL   Comment 1 Notify RN    GLUCOSE, CAPILLARY     Status: None   Collection Time    10/28/13  3:07 AM      Result Value Ref Range   Glucose-Capillary 81  70 - 99 mg/dL   Comment 1 Notify RN    CBC WITH DIFFERENTIAL     Status: Abnormal   Collection Time    10/28/13  5:32 AM      Result Value Ref Range   WBC 15.2 (*) 4.0 - 10.5 K/uL   RBC 3.98  3.87 - 5.11 MIL/uL   Hemoglobin 11.4 (*) 12.0 - 15.0 g/dL   HCT 32.8 (*) 36.0 - 46.0 %   MCV 82.4  78.0 - 100.0 fL   MCH 28.6  26.0 - 34.0 pg   MCHC 34.8  30.0 - 36.0 g/dL   RDW 15.4  11.5 - 15.5 %   Platelets 298  150 - 400 K/uL   Neutrophils Relative % 75  43 - 77 %   Lymphocytes Relative 11 (*) 12 - 46 %   Monocytes Relative 13 (*) 3 - 12 %   Eosinophils Relative 1  0 - 5 %   Basophils Relative 0  0 - 1 %  Neutro Abs 11.3 (*) 1.7 - 7.7 K/uL   Lymphs Abs 1.7  0.7 - 4.0 K/uL   Monocytes Absolute 2.0 (*) 0.1 - 1.0 K/uL   Eosinophils Absolute 0.2  0.0 - 0.7 K/uL   Basophils Absolute 0.0  0.0 - 0.1 K/uL   RBC Morphology RARE NRBCs    BASIC METABOLIC PANEL     Status: Abnormal   Collection Time    10/28/13  5:32 AM      Result Value Ref Range   Sodium 136 (*) 137 - 147 mEq/L   Potassium 2.9 (*) 3.7 - 5.3 mEq/L   Comment: DELTA CHECK NOTED     REPEATED TO VERIFY     CRITICAL RESULT CALLED TO, READ BACK BY AND VERIFIED WITH:     HARDY,S RN AT 4765 06.11.15 BY TIBBITTS,K   Chloride 98  96 - 112 mEq/L   CO2 20  19 - 32 mEq/L   Glucose, Bld 99  70 - 99 mg/dL   BUN 42 (*) 6 - 23 mg/dL   Creatinine, Ser 2.36 (*) 0.50 - 1.10 mg/dL   Calcium 7.1 (*) 8.4 - 10.5 mg/dL   GFR calc non Af Amer 20 (*) >90 mL/min   GFR calc Af Amer 24 (*)  >90 mL/min   Comment: (NOTE)     The eGFR has been calculated using the CKD EPI equation.     This calculation has not been validated in all clinical situations.     eGFR's persistently <90 mL/min signify possible Chronic Kidney     Disease.    Radiology/Results: No results found.  Anti-infectives: Anti-infectives   Start     Dose/Rate Route Frequency Ordered Stop   10/19/13 0600  cefOXitin (MEFOXIN) 2 g in dextrose 5 % 50 mL IVPB     2 g 100 mL/hr over 30 Minutes Intravenous On call to O.R. 10/19/13 0534 10/19/13 1154      Assessment/Plan: Problem List: Patient Active Problem List   Diagnosis Date Noted  . 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 S/P gastric bypass 10/19/2013  . Diabetes 09/29/2013  . Thoracotomy scar of right chest-decortication at [facility] 09/29/2013  . 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 Obesity, unspecified 10/22/2012    K down to 2.9-will defer to Dr. Mercy Moore about replacement.  Creatinine is down a bit today.   WBC was up-will recheck tomorrow.  Not on antibiotics.   4 Days Post-Op    LOS: 9 days   Matt B. Hassell Done, MD, [facility] [phone] beeper [phone]  10/28/2013 7:35 AM

## 2013-10-28 NOTE — Progress Notes (Signed)
[place], RN, BSN, CCM  [phone]  Chart Reviewed for discharge and hospital needs.  Discharge needs at time of review: None present will follow for needs.  Review of patient progress due on [id number].

## 2013-10-28 NOTE — Progress Notes (Signed)
S: Feels well, Able to take PO fluids O:BP 122/57  Pulse 80  Temp(Src) 98 F (36.7 C) (Oral)  Resp 11  Ht 5\' 6"  (1.676 m)  Wt 122.4 kg (269 lb 13.5 oz)  BMI 43.57 kg/m2  SpO2 91%  Intake/Output Summary (Last 24 hours) at 10/28/13 0937 Last data filed at 10/28/13 0900  Gross per 24 hour  Intake 1769.17 ml  Output   4830 ml  Net -3060.83 ml   Weight change:  CVE:LFYBO and alert CVS:RRR Resp:Clear ant Abd:+ BS. Brown liquid in ostomy Ext:0-tr edema NEURO:CNI M&SI OX3 no asterixis   . antiseptic oral rinse  15 mL Mouth Rinse q12n4p  . chlorhexidine  15 mL Mouth Rinse BID  . heparin subcutaneous  5,000 Units Subcutaneous 3 times per day  . insulin aspart  0-20 Units Subcutaneous 6 times per day  . pantoprazole (PROTONIX) IV  40 mg Intravenous Q12H   No results found. BMET    Component Value Date/Time   NA 136* 10/28/2013 0532   K 2.9* 10/28/2013 0532   CL 98 10/28/2013 0532   CO2 20 10/28/2013 0532   GLUCOSE 99 10/28/2013 0532   BUN 42* 10/28/2013 0532   CREATININE 2.36* 10/28/2013 0532   CALCIUM 7.1* 10/28/2013 0532   GFRNONAA 20* 10/28/2013 0532   GFRAA 24* 10/28/2013 0532   CBC    Component Value Date/Time   WBC 15.2* 10/28/2013 0532   RBC 3.98 10/28/2013 0532   HGB 11.4* 10/28/2013 0532   HCT 32.8* 10/28/2013 0532   PLT 298 10/28/2013 0532   MCV 82.4 10/28/2013 0532   MCH 28.6 10/28/2013 0532   MCHC 34.8 10/28/2013 0532   RDW 15.4 10/28/2013 0532   LYMPHSABS 1.7 10/28/2013 0532   MONOABS 2.0* 10/28/2013 0532   EOSABS 0.2 10/28/2013 0532   BASOSABS 0.0 10/28/2013 0532     Assessment: 1. AKI sec ATN from hypotension and contrast. Renal fx improving and UO excellent 2. Hyponatremia improved 3. SP gastric bypass and Lt transverse colectomy with colostomy 4. Hypotension, resolved 5. Hypokalemia  Plan: 1. Cont IV fluids for 1 more day as PO intake marginal 2. Replace K IV as she has a central line 3. Recheck renal fx in AM  Carol Jackson T

## 2013-10-28 NOTE — Progress Notes (Signed)
[place] Progress Note Patient Name: CARLYON NOLASCO DOB: Oct 06, 1945 MRN: [id number]  Date of Service  10/28/2013   HPI/Events of Note   Int hypoxiam, concern osa? likley thi s from narcs  eICU Interventions  pcxr Tele, satys IS llimits narcs when able    Intervention Category Major Interventions: Hypoxemia - evaluation and management  FEINSTEIN,DANIEL J. 10/28/2013, 7:01 PM

## 2013-10-29 LAB — GLUCOSE, CAPILLARY
GLUCOSE-CAPILLARY: 97 mg/dL (ref 70–99)
GLUCOSE-CAPILLARY: 99 mg/dL (ref 70–99)
Glucose-Capillary: 101 mg/dL — ABNORMAL HIGH (ref 70–99)
Glucose-Capillary: 108 mg/dL — ABNORMAL HIGH (ref 70–99)
Glucose-Capillary: 112 mg/dL — ABNORMAL HIGH (ref 70–99)
Glucose-Capillary: 78 mg/dL (ref 70–99)
Glucose-Capillary: 83 mg/dL (ref 70–99)

## 2013-10-29 LAB — BASIC METABOLIC PANEL
BUN: 34 mg/dL — AB (ref 6–23)
CALCIUM: 7.4 mg/dL — AB (ref 8.4–10.5)
CO2: 22 mEq/L (ref 19–32)
Chloride: 100 mEq/L (ref 96–112)
Creatinine, Ser: 1.83 mg/dL — ABNORMAL HIGH (ref 0.50–1.10)
GFR calc Af Amer: 32 mL/min — ABNORMAL LOW (ref >90)
GFR calc non Af Amer: 28 mL/min — ABNORMAL LOW (ref >90)
GLUCOSE: 103 mg/dL — AB (ref 70–99)
Potassium: 2.9 mEq/L — CL (ref 3.7–5.3)
Sodium: 137 mEq/L (ref 137–147)

## 2013-10-29 LAB — CBC WITH DIFFERENTIAL/PLATELET
Basophils Absolute: 0 10*3/uL (ref 0.0–0.1)
Basophils Relative: 0 % (ref 0–1)
EOS PCT: 1 % (ref 0–5)
Eosinophils Absolute: 0.1 10*3/uL (ref 0.0–0.7)
HCT: 32 % — ABNORMAL LOW (ref 36.0–46.0)
Hemoglobin: 11.1 g/dL — ABNORMAL LOW (ref 12.0–15.0)
Lymphocytes Relative: 15 % (ref 12–46)
Lymphs Abs: 2.2 10*3/uL (ref 0.7–4.0)
MCH: 28.7 pg (ref 26.0–34.0)
MCHC: 34.7 g/dL (ref 30.0–36.0)
MCV: 82.7 fL (ref 78.0–100.0)
Monocytes Absolute: 1.7 10*3/uL — ABNORMAL HIGH (ref 0.1–1.0)
Monocytes Relative: 12 % (ref 3–12)
Neutro Abs: 10.4 10*3/uL — ABNORMAL HIGH (ref 1.7–7.7)
Neutrophils Relative %: 72 % (ref 43–77)
PLATELETS: 317 10*3/uL (ref 150–400)
RBC: 3.87 MIL/uL (ref 3.87–5.11)
RDW: 15.6 % — ABNORMAL HIGH (ref 11.5–15.5)
WBC: 14.4 10*3/uL — ABNORMAL HIGH (ref 4.0–10.5)

## 2013-10-29 LAB — MAGNESIUM: Magnesium: 1.4 mg/dL — ABNORMAL LOW (ref 1.5–2.5)

## 2013-10-29 LAB — PHOSPHORUS: PHOSPHORUS: 3.4 mg/dL (ref 2.3–4.6)

## 2013-10-29 MED ORDER — ADULT MULTIVITAMIN W/MINERALS CH
1.0000 | ORAL_TABLET | Freq: Two times a day (BID) | ORAL | Status: DC
  Administered 2013-10-29 – 2013-11-09 (×22): 1 via ORAL
  Filled 2013-10-29 (×28): qty 1

## 2013-10-29 MED ORDER — MAGNESIUM SULFATE 40 MG/ML IJ SOLN
2.0000 g | Freq: Once | INTRAMUSCULAR | Status: AC
  Administered 2013-10-29: 2 g via INTRAVENOUS
  Filled 2013-10-29: qty 50

## 2013-10-29 MED ORDER — UNJURY VANILLA POWDER
4.0000 [oz_av] | Freq: Two times a day (BID) | ORAL | Status: DC
  Administered 2013-10-29 – 2013-11-06 (×12): 4 [oz_av] via ORAL
  Administered 2013-11-06: 09:00:00 via ORAL
  Administered 2013-11-07 – 2013-11-09 (×3): 4 [oz_av] via ORAL
  Filled 2013-10-29 (×6): qty 27

## 2013-10-29 MED ORDER — UNJURY CHOCOLATE CLASSIC POWDER
4.0000 [oz_av] | Freq: Two times a day (BID) | ORAL | Status: DC
  Administered 2013-10-30 – 2013-11-05 (×8): 4 [oz_av] via ORAL
  Filled 2013-10-29 (×6): qty 27

## 2013-10-29 MED ORDER — POTASSIUM CHLORIDE 10 MEQ/100ML IV SOLN
10.0000 meq | INTRAVENOUS | Status: AC
  Administered 2013-10-29 (×4): 10 meq via INTRAVENOUS
  Filled 2013-10-29 (×4): qty 100

## 2013-10-29 NOTE — Progress Notes (Signed)
Patient ID: Carol Jackson, female   DOB: [birth year], 68 y.o.   MRN: [id number] [facility] Surgery Progress Note:   5 Days Post-Op  Subjective: Mental status is clear.  Feeling better Objective: Vital signs in last 24 hours: Temp:  [97.9 F (36.6 C)-98.4 F (36.9 C)] 98 F (36.7 C) (06/12 0400) Pulse Rate:  [73-87] 73 (06/12 0600) Resp:  [7-21] 9 (06/12 0600) BP: (119-141)/(47-64) 120/55 mmHg (06/12 0600) SpO2:  [88 %-96 %] 96 % (06/12 0600) Weight:  [258 lb 9.6 oz (117.3 kg)] 258 lb 9.6 oz (117.3 kg) (06/12 0400)  Intake/Output from previous day: 06/11 0701 - 06/12 0700 In: 2280 [P.O.:1080; I.V.:1000; IV Piggyback:200] Out: 3025 [Urine:2840; Drains:35; Stool:150] Intake/Output this shift:    Physical Exam: Work of breathing is more normal.  Ostomy functioning.   Lab Results:  Results for orders placed during the hospital encounter of 10/19/13 (from the past 48 hour(s))  GLUCOSE, CAPILLARY     Status: None   Collection Time    10/27/13 12:00 PM      Result Value Ref Range   Glucose-Capillary 90  70 - 99 mg/dL  GLUCOSE, CAPILLARY     Status: None   Collection Time    10/27/13  3:46 PM      Result Value Ref Range   Glucose-Capillary 84  70 - 99 mg/dL  GLUCOSE, CAPILLARY     Status: Abnormal   Collection Time    10/27/13  7:49 PM      Result Value Ref Range   Glucose-Capillary 102 (*) 70 - 99 mg/dL  GLUCOSE, CAPILLARY     Status: None   Collection Time    10/27/13 11:53 PM      Result Value Ref Range   Glucose-Capillary 94  70 - 99 mg/dL   Comment 1 Notify RN    GLUCOSE, CAPILLARY     Status: None   Collection Time    10/28/13  3:07 AM      Result Value Ref Range   Glucose-Capillary 81  70 - 99 mg/dL   Comment 1 Notify RN    CBC WITH DIFFERENTIAL     Status: Abnormal   Collection Time    10/28/13  5:32 AM      Result Value Ref Range   WBC 15.2 (*) 4.0 - 10.5 K/uL   RBC 3.98  3.87 - 5.11 MIL/uL   Hemoglobin 11.4 (*) 12.0 - 15.0 g/dL   HCT 32.8 (*)  36.0 - 46.0 %   MCV 82.4  78.0 - 100.0 fL   MCH 28.6  26.0 - 34.0 pg   MCHC 34.8  30.0 - 36.0 g/dL   RDW 15.4  11.5 - 15.5 %   Platelets 298  150 - 400 K/uL   Neutrophils Relative % 75  43 - 77 %   Lymphocytes Relative 11 (*) 12 - 46 %   Monocytes Relative 13 (*) 3 - 12 %   Eosinophils Relative 1  0 - 5 %   Basophils Relative 0  0 - 1 %   Neutro Abs 11.3 (*) 1.7 - 7.7 K/uL   Lymphs Abs 1.7  0.7 - 4.0 K/uL   Monocytes Absolute 2.0 (*) 0.1 - 1.0 K/uL   Eosinophils Absolute 0.2  0.0 - 0.7 K/uL   Basophils Absolute 0.0  0.0 - 0.1 K/uL   RBC Morphology RARE NRBCs    BASIC METABOLIC PANEL     Status: Abnormal   Collection Time  10/28/13  5:32 AM      Result Value Ref Range   Sodium 136 (*) 137 - 147 mEq/L   Potassium 2.9 (*) 3.7 - 5.3 mEq/L   Comment: DELTA CHECK NOTED     REPEATED TO VERIFY     CRITICAL RESULT CALLED TO, READ BACK BY AND VERIFIED WITH:     HARDY,S RN AT 0612 06.11.15 BY TIBBITTS,K   Chloride 98  96 - 112 mEq/L   CO2 20  19 - 32 mEq/L   Glucose, Bld 99  70 - 99 mg/dL   BUN 42 (*) 6 - 23 mg/dL   Creatinine, Ser 2.36 (*) 0.50 - 1.10 mg/dL   Calcium 7.1 (*) 8.4 - 10.5 mg/dL   GFR calc non Af Amer 20 (*) >90 mL/min   GFR calc Af Amer 24 (*) >90 mL/min   Comment: (NOTE)     The eGFR has been calculated using the CKD EPI equation.     This calculation has not been validated in all clinical situations.     eGFR's persistently <90 mL/min signify possible Chronic Kidney     Disease.  GLUCOSE, CAPILLARY     Status: None   Collection Time    10/28/13  7:45 AM      Result Value Ref Range   Glucose-Capillary 97  70 - 99 mg/dL   Comment 1 Notify RN     Comment 2 Documented in Chart    GLUCOSE, CAPILLARY     Status: Abnormal   Collection Time    10/28/13 11:34 AM      Result Value Ref Range   Glucose-Capillary 108 (*) 70 - 99 mg/dL   Comment 1 Notify RN     Comment 2 Documented in Chart    GLUCOSE, CAPILLARY     Status: Abnormal   Collection Time    10/28/13   3:51 PM      Result Value Ref Range   Glucose-Capillary 101 (*) 70 - 99 mg/dL  GLUCOSE, CAPILLARY     Status: None   Collection Time    10/28/13  8:25 PM      Result Value Ref Range   Glucose-Capillary 91  70 - 99 mg/dL  GLUCOSE, CAPILLARY     Status: None   Collection Time    10/29/13 12:15 AM      Result Value Ref Range   Glucose-Capillary 97  70 - 99 mg/dL  BASIC METABOLIC PANEL     Status: Abnormal   Collection Time    10/29/13  3:25 AM      Result Value Ref Range   Sodium 137  137 - 147 mEq/L   Potassium 2.9 (*) 3.7 - 5.3 mEq/L   Comment: CRITICAL RESULT CALLED TO, READ BACK BY AND VERIFIED WITH:     C.AYERS RN AT 0407 ON [id number] BY C.BONGEL   Chloride 100  96 - 112 mEq/L   CO2 22  19 - 32 mEq/L   Glucose, Bld 103 (*) 70 - 99 mg/dL   BUN 34 (*) 6 - 23 mg/dL   Creatinine, Ser 1.83 (*) 0.50 - 1.10 mg/dL   Calcium 7.4 (*) 8.4 - 10.5 mg/dL   GFR calc non Af Amer 28 (*) >90 mL/min   GFR calc Af Amer 32 (*) >90 mL/min   Comment: (NOTE)     The eGFR has been calculated using the CKD EPI equation.     This calculation has not been validated in all clinical situations.  eGFR's persistently <90 mL/min signify possible Chronic Kidney     Disease.  CBC WITH DIFFERENTIAL     Status: Abnormal   Collection Time    10/29/13  3:25 AM      Result Value Ref Range   WBC 14.4 (*) 4.0 - 10.5 K/uL   RBC 3.87  3.87 - 5.11 MIL/uL   Hemoglobin 11.1 (*) 12.0 - 15.0 g/dL   HCT 32.0 (*) 36.0 - 46.0 %   MCV 82.7  78.0 - 100.0 fL   MCH 28.7  26.0 - 34.0 pg   MCHC 34.7  30.0 - 36.0 g/dL   RDW 15.6 (*) 11.5 - 15.5 %   Platelets 317  150 - 400 K/uL   Neutrophils Relative % 72  43 - 77 %   Lymphocytes Relative 15  12 - 46 %   Monocytes Relative 12  3 - 12 %   Eosinophils Relative 1  0 - 5 %   Basophils Relative 0  0 - 1 %   Neutro Abs 10.4 (*) 1.7 - 7.7 K/uL   Lymphs Abs 2.2  0.7 - 4.0 K/uL   Monocytes Absolute 1.7 (*) 0.1 - 1.0 K/uL   Eosinophils Absolute 0.1  0.0 - 0.7 K/uL   Basophils  Absolute 0.0  0.0 - 0.1 K/uL   RBC Morphology POLYCHROMASIA PRESENT     Comment: BASOPHILIC STIPPLING   WBC Morphology VACUOLATED NEUTROPHILS    MAGNESIUM     Status: Abnormal   Collection Time    10/29/13  3:25 AM      Result Value Ref Range   Magnesium 1.4 (*) 1.5 - 2.5 mg/dL  PHOSPHORUS     Status: None   Collection Time    10/29/13  3:25 AM      Result Value Ref Range   Phosphorus 3.4  2.3 - 4.6 mg/dL  GLUCOSE, CAPILLARY     Status: None   Collection Time    10/29/13  3:50 AM      Result Value Ref Range   Glucose-Capillary 99  70 - 99 mg/dL    Radiology/Results: Dg Chest Port 1 View  10/28/2013   CLINICAL DATA:  Shortness of breath  EXAM: PORTABLE CHEST - 1 VIEW  COMPARISON:  10/24/2013  FINDINGS: Cardiac shadow is stable. The right jugular catheter has been withdrawn significantly an lies in the right innominate vein. Lungs are slightly improved in aeration. Bibasilar atelectatic changes are noted. The left basilar changes are new from the prior study.  IMPRESSION: New left basilar atelectasis.  Interval withdrawal of the right jugular catheter. Clinical correlation is recommended.   Electronically Signed   By: Inez Catalina M.D.   On: 10/28/2013 20:03    Anti-infectives: Anti-infectives   Start     Dose/Rate Route Frequency Ordered Stop   10/28/13 1830  cefTRIAXone (ROCEPHIN) 1 g in dextrose 5 % 50 mL IVPB     1 g 100 mL/hr over 30 Minutes Intravenous Every 24 hours 10/28/13 1759     10/19/13 0600  cefOXitin (MEFOXIN) 2 g in dextrose 5 % 50 mL IVPB     2 g 100 mL/hr over 30 Minutes Intravenous On call to O.R. 10/19/13 0534 10/19/13 1154      Assessment/Plan: Problem List: Patient Active Problem List   Diagnosis Date Noted  . 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 S/P gastric bypass 10/19/2013  . Diabetes 09/29/2013  . Thoracotomy scar  of right chest-decortication at [facility] 09/29/2013  . Post-pancreatectomy  diabetes-splenectomy at [facility] 09/29/2013  . 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 Obesity, unspecified 10/22/2012    WBC down.  Creatinine decreased.  Will discontinue central line (and CVPs).  Foley per nephrology.  Ostomy functioning.  Advance to full liquids.  Lab ordered for Sat am.   5 Days Post-Op    LOS: 10 days   Matt B. Hassell Done, MD, [facility] [phone] beeper [phone]  10/29/2013 8:17 AM

## 2013-10-29 NOTE — Progress Notes (Signed)
S: Taking fluids well, eating some yogurt O:BP 147/58  Pulse 76  Temp(Src) 98.9 F (37.2 C) (Oral)  Resp 14  Ht 5\' 6"  (1.676 m)  Wt 117.3 kg (258 lb 9.6 oz)  BMI 41.76 kg/m2  SpO2 98%  Intake/Output Summary (Last 24 hours) at 10/29/13 1315 Last data filed at 10/29/13 1300  Gross per 24 hour  Intake   1760 ml  Output   3350 ml  Net  -1590 ml   Weight change:  YYT:KPTWS and alert CVS:RRR Resp:Clear ant Abd:+ BS. Brown liquid in ostomy Ext: tr edema NEURO:CNI M&SI OX3 no asterixis   . antiseptic oral rinse  15 mL Mouth Rinse q12n4p  . cefTRIAXone (ROCEPHIN)  IV  1 g Intravenous Q24H  . chlorhexidine  15 mL Mouth Rinse BID  . heparin subcutaneous  5,000 Units Subcutaneous 3 times per day  . insulin aspart  0-20 Units Subcutaneous 6 times per day  . pantoprazole (PROTONIX) IV  40 mg Intravenous Q12H   Dg Chest Port 1 View  10/28/2013   CLINICAL DATA:  Shortness of breath  EXAM: PORTABLE CHEST - 1 VIEW  COMPARISON:  10/24/2013  FINDINGS: Cardiac shadow is stable. The right jugular catheter has been withdrawn significantly an lies in the right innominate vein. Lungs are slightly improved in aeration. Bibasilar atelectatic changes are noted. The left basilar changes are new from the prior study.  IMPRESSION: New left basilar atelectasis.  Interval withdrawal of the right jugular catheter. Clinical correlation is recommended.   Electronically Signed   By: Inez Catalina M.D.   On: 10/28/2013 20:03   BMET    Component Value Date/Time   NA 137 10/29/2013 0325   K 2.9* 10/29/2013 0325   CL 100 10/29/2013 0325   CO2 22 10/29/2013 0325   GLUCOSE 103* 10/29/2013 0325   BUN 34* 10/29/2013 0325   CREATININE 1.83* 10/29/2013 0325   CALCIUM 7.4* 10/29/2013 0325   GFRNONAA 28* 10/29/2013 0325   GFRAA 32* 10/29/2013 0325   CBC    Component Value Date/Time   WBC 14.4* 10/29/2013 0325   RBC 3.87 10/29/2013 0325   HGB 11.1* 10/29/2013 0325   HCT 32.0* 10/29/2013 0325   PLT 317 10/29/2013 0325   MCV  82.7 10/29/2013 0325   MCH 28.7 10/29/2013 0325   MCHC 34.7 10/29/2013 0325   RDW 15.6* 10/29/2013 0325   LYMPHSABS 2.2 10/29/2013 0325   MONOABS 1.7* 10/29/2013 0325   EOSABS 0.1 10/29/2013 0325   BASOSABS 0.0 10/29/2013 0325     Assessment: 1. AKI sec ATN from hypotension and contrast. Renal fx improving and UO excellent 2. Hyponatremia improved 3. SP gastric bypass and Lt transverse colectomy with colostomy 4. Hypotension, resolved 5. Hypokalemia 6. Hypomagnesemia Plan: 1. She received 4 runs KCL this AM for the hypokalemia 2. DC IV fluids 3. DC foley 4. Scr in AM 5. Replace Mag Tekoa Hamor T

## 2013-10-29 NOTE — Progress Notes (Signed)
INITIAL NUTRITION ASSESSMENT  DOCUMENTATION CODES Per approved criteria  -Morbid Obesity   INTERVENTION: -  Unjury protein shake bid  -  MVI bid -  Diet advancement per MD  NUTRITION DIAGNOSIS: Inadequate oral intake related to altered gi function as evidenced by post gastric surgery and colostomy..   Goal: Intake of >50% meals and supplements.  Monitor:  Intake, labs, weight trend.  Reason for Assessment: length of stay  68 y.o. female  Admitting Dx: <principal problem not specified>  ASSESSMENT: Carol Jackson is an 68 y.o. female who underwent Lapband surgery in 2005 and then the long-term has failed. Current weight is 249 despite 6 months supervised weight loss. 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 Subsequently she developed an empyema on the right side and underwent a decortication.    S/p removal of lap band and convert to Roux-en-Y gastric bypass 10/19/13.  Colonic obstruction at splenic flexure secondary to stapled colon now s/yleft transverse colon resection, right transverse end colostomy.Marland Kitchen  AKI sec ATN from hypotension and contrast.  Renal function improving and UO excellent.  6/12: Patient sleeping at time of visit.  Nurse reports patient is tolerating full liquid diet well with small intake.    Height: Ht Readings from Last 1 Encounters:  10/24/13 5\' 6"  (1.676 m)    Weight: Wt Readings from Last 1 Encounters:  10/29/13 258 lb 9.6 oz (117.3 kg)    Ideal Body Weight: 130 lbs  % Ideal Body Weight: 198  Wt Readings from Last 10 Encounters:  10/29/13 258 lb 9.6 oz (117.3 kg)  10/29/13 258 lb 9.6 oz (117.3 kg)  10/29/13 258 lb 9.6 oz (117.3 kg)  10/12/13 245 lb (111.131 kg)  10/04/13 250 lb 8 oz (113.626 kg)  09/29/13 249 lb (112.946 kg)  12/03/12 241 lb 9.6 oz (109.589 kg)  10/22/12 239 lb 3.2 oz (108.5 kg)    Usual Body Weight: 239  lbs 1 year ago.  % Usual Body Weight: 108 some weight gain secondary to fluid  BMI:  Body mass index is 41.76 kg/(m^2).  Estimated Nutritional Needs: Kcal: 1700-1800 Protein: 90-110 gm Fluid: >2L daily  Skin: colostomy, surgical incision, otherwise intact  Diet Order: Full Liquid  EDUCATION NEEDS: -Education not appropriate at this time   Intake/Output Summary (Last 24 hours) at 10/29/13 1740 Last data filed at 10/29/13 1600  Gross per 24 hour  Intake   1570 ml  Output   2290 ml  Net   -720 ml    Last BM: today   Labs:   Recent Labs Lab 10/25/13 0640 10/26/13 0528 10/27/13 0600 10/28/13 0532 10/29/13 0325  NA 138 129* 131* 136* 137  K 4.1 4.2 3.5* 2.9* 2.9*  CL 103 97 96 98 100  CO2 21 18* 17* 20 22  BUN 22 37* 45* 42* 34*  CREATININE 1.82* 2.82* 2.95* 2.36* 1.83*  CALCIUM 7.1* 7.1* 7.1* 7.1* 7.4*  MG  --   --   --   --  1.4*  PHOS  --  5.3*  --   --  3.4  GLUCOSE 103* 96 79 99 103*    CBG (last 3)   Recent Labs  10/29/13 0825 10/29/13 1208 10/29/13 1548  GLUCAP 83 78 101*    Scheduled Meds: . antiseptic oral rinse  15 mL Mouth Rinse q12n4p  . cefTRIAXone (ROCEPHIN)  IV  1 g Intravenous Q24H  . chlorhexidine  15 mL Mouth Rinse BID  . heparin subcutaneous  5,000 Units Subcutaneous 3 times per day  . insulin aspart  0-20 Units Subcutaneous 6 times per day  . multivitamin with minerals  1 tablet Oral BID WC  . pantoprazole (PROTONIX) IV  40 mg Intravenous Q12H  . protein supplement  4 oz Oral BID PC  . protein supplement  4 oz Oral BID    Continuous Infusions:   Past Medical History  Diagnosis Date  . Diabetes mellitus without complication   . GERD (gastroesophageal reflux disease)   . Hyperlipidemia   . Hypertension   . Thyroid disease     hypothyroidism  . Morbid obesity   . Pneumonia     hxof 2013 had booster for pneumonia 09/2013   . Hypothyroidism   . Arthritis     back     Past Surgical History  Procedure Laterality Date   . Laparoscopic gastric banding  04/03/04  . Abdominal hysterectomy  01/15/00  . Bladder suspension  11/08/08  . 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 Pancreas surgery  09/24/11    tail end removed  . Lung surgery  05/06/12  . Back surgery  1982 adn 1985     discs removed lumbar area   . Colonoscopy  2014   . 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 Laparoscopy N/A 10/24/2013    Procedure: LAPAROSCOPY DIAGNOSTIC, EXPLORATORY LAPAROTOMY, LEFT TRANSVERSE COLON RESECTION WITH COLOSTOMY, UPPER ENDOSCOPY;  Surgeon: Shann Medal, MD;  Location: [facility];  Service: General;  Laterality: N/A;  . Central venous catheter insertion Right 10/24/2013    Procedure: INSERTION CENTRAL LINE ADULT;  Surgeon: Shann Medal, MD;  Location: [facility];  Service: General;  Laterality: Right;    Antonieta Iba, RD, LDN Clinical Inpatient Dietitian Pager:  [phone] Weekend and after hours pager:  [phone]

## 2013-10-29 NOTE — Progress Notes (Signed)
CRITICAL VALUE ALERT  Critical value received:  k 2.9 Date of notification:  10/29/13  Time of notification: 0415 Critical value read back:yes  Nurse who received alert:  c Marki Frede  MD notified (1st page):  elink md Time of first page: 0416 MD notified (2nd page):  Time of second page:  Responding MD: elink md Time MD responded:  [phone]  Orders received for 4 KCl runs.

## 2013-10-29 NOTE — Progress Notes (Signed)
Patient ID: Carol Jackson, female   DOB: 03-24-1946, 68 y.o.   MRN: [id number]   [facility] Physician Progress Note and Electrolyte Replacement  Patient Name: Carol Jackson DOB: [birth year] MRN: [id number]  Date of Service  10/29/2013   HPI/Events of Note    Recent Labs Lab 10/25/13 0640 10/26/13 0528 10/27/13 0600 10/28/13 0532 10/29/13 0325  NA 138 129* 131* 136* 137  K 4.1 4.2 3.5* 2.9* 2.9*  CL 103 97 96 98 100  CO2 21 18* 17* 20 22  GLUCOSE 103* 96 79 99 103*  BUN 22 37* 45* 42* 34*  CREATININE 1.82* 2.82* 2.95* 2.36* 1.83*  CALCIUM 7.1* 7.1* 7.1* 7.1* 7.4*  PHOS  --  5.3*  --   --   --     Estimated Creatinine Clearance: 40.3 ml/min (by C-G formula based on Cr of 1.83).  Intake/Output     06/11 0701 - 06/12 0700   P.O. 1080   I.V. (mL/kg) 950 (7.8)   Total Intake(mL/kg) 2030 (16.6)   Urine (mL/kg/hr) 2715 (0.9)   Drains 35 (0)   Stool 150 (0.1)   Total Output 2900   Net -870        - I/O DETAILED x 24h    Total I/O In: 350 [I.V.:350] Out: 620 [Urine:615; Drains:5] - I/O THIS SHIFT    ASSESSMENT Low k  eICURN Interventions  Replete via IV - kcl Check mag and phos    ASSESSMENT: MAJOR ELECTROLYTE      Dr. Brand Males, M.D., F.C.C.P Pulmonary and Critical Care Medicine Staff Physician [facility] Pager: [phone], If no answer or between  15:00h - 7:00h: call [phone]  10/29/2013 4:11 AM

## 2013-10-30 LAB — CBC WITH DIFFERENTIAL/PLATELET
BASOS PCT: 1 % (ref 0–1)
Basophils Absolute: 0.1 10*3/uL (ref 0.0–0.1)
EOS ABS: 0.1 10*3/uL (ref 0.0–0.7)
EOS PCT: 1 % (ref 0–5)
HCT: 33.6 % — ABNORMAL LOW (ref 36.0–46.0)
HEMOGLOBIN: 11.5 g/dL — AB (ref 12.0–15.0)
LYMPHS PCT: 15 % (ref 12–46)
Lymphs Abs: 2.2 10*3/uL (ref 0.7–4.0)
MCH: 28.2 pg (ref 26.0–34.0)
MCHC: 34.2 g/dL (ref 30.0–36.0)
MCV: 82.4 fL (ref 78.0–100.0)
Monocytes Absolute: 1.9 10*3/uL — ABNORMAL HIGH (ref 0.1–1.0)
Monocytes Relative: 13 % — ABNORMAL HIGH (ref 3–12)
NEUTROS PCT: 70 % (ref 43–77)
Neutro Abs: 10.6 10*3/uL — ABNORMAL HIGH (ref 1.7–7.7)
Platelets: 365 10*3/uL (ref 150–400)
RBC: 4.08 MIL/uL (ref 3.87–5.11)
RDW: 15.6 % — ABNORMAL HIGH (ref 11.5–15.5)
WBC MORPHOLOGY: INCREASED
WBC: 14.9 10*3/uL — ABNORMAL HIGH (ref 4.0–10.5)

## 2013-10-30 LAB — RENAL FUNCTION PANEL
ALBUMIN: 1.7 g/dL — AB (ref 3.5–5.2)
BUN: 26 mg/dL — ABNORMAL HIGH (ref 6–23)
CHLORIDE: 99 meq/L (ref 96–112)
CO2: 23 mEq/L (ref 19–32)
CREATININE: 1.36 mg/dL — AB (ref 0.50–1.10)
Calcium: 7.8 mg/dL — ABNORMAL LOW (ref 8.4–10.5)
GFR calc Af Amer: 46 mL/min — ABNORMAL LOW (ref >90)
GFR calc non Af Amer: 40 mL/min — ABNORMAL LOW (ref >90)
Glucose, Bld: 122 mg/dL — ABNORMAL HIGH (ref 70–99)
POTASSIUM: 2.8 meq/L — AB (ref 3.7–5.3)
Phosphorus: 2.3 mg/dL (ref 2.3–4.6)
Sodium: 136 mEq/L — ABNORMAL LOW (ref 137–147)

## 2013-10-30 LAB — GLUCOSE, CAPILLARY
GLUCOSE-CAPILLARY: 137 mg/dL — AB (ref 70–99)
GLUCOSE-CAPILLARY: 98 mg/dL (ref 70–99)
GLUCOSE-CAPILLARY: 126 mg/dL — AB (ref 70–99)
Glucose-Capillary: 102 mg/dL — ABNORMAL HIGH (ref 70–99)
Glucose-Capillary: 129 mg/dL — ABNORMAL HIGH (ref 70–99)

## 2013-10-30 LAB — MAGNESIUM: MAGNESIUM: 1.6 mg/dL (ref 1.5–2.5)

## 2013-10-30 MED ORDER — MAGNESIUM SULFATE 40 MG/ML IJ SOLN
2.0000 g | Freq: Once | INTRAMUSCULAR | Status: AC
  Administered 2013-10-30: 2 g via INTRAVENOUS
  Filled 2013-10-30: qty 50

## 2013-10-30 MED ORDER — POTASSIUM CHLORIDE 20 MEQ/15ML (10%) PO LIQD
40.0000 meq | ORAL | Status: AC
  Administered 2013-10-30 (×2): 40 meq via ORAL
  Filled 2013-10-30 (×2): qty 30

## 2013-10-30 NOTE — Progress Notes (Signed)
S: No new CO O:BP 158/66  Pulse 72  Temp(Src) 97.8 F (36.6 C) (Oral)  Resp 13  Ht 5\' 6"  (1.676 m)  Wt 117.3 kg (258 lb 9.6 oz)  BMI 41.76 kg/m2  SpO2 96%  Intake/Output Summary (Last 24 hours) at 10/30/13 1059 Last data filed at 10/30/13 0900  Gross per 24 hour  Intake    850 ml  Output   2395 ml  Net  -1545 ml   Weight change:  GNF:AOZHY and alert CVS:RRR Resp:Clear Abd:+ BS. Brown liquid in ostomy Ext: tr edema NEURO:CNI M&SI OX3 no asterixis   . antiseptic oral rinse  15 mL Mouth Rinse q12n4p  . cefTRIAXone (ROCEPHIN)  IV  1 g Intravenous Q24H  . chlorhexidine  15 mL Mouth Rinse BID  . heparin subcutaneous  5,000 Units Subcutaneous 3 times per day  . insulin aspart  0-20 Units Subcutaneous 6 times per day  . multivitamin with minerals  1 tablet Oral BID WC  . pantoprazole (PROTONIX) IV  40 mg Intravenous Q12H  . protein supplement  4 oz Oral BID PC  . protein supplement  4 oz Oral BID   Dg Chest Port 1 View  10/28/2013   CLINICAL DATA:  Shortness of breath  EXAM: PORTABLE CHEST - 1 VIEW  COMPARISON:  10/24/2013  FINDINGS: Cardiac shadow is stable. The right jugular catheter has been withdrawn significantly an lies in the right innominate vein. Lungs are slightly improved in aeration. Bibasilar atelectatic changes are noted. The left basilar changes are new from the prior study.  IMPRESSION: New left basilar atelectasis.  Interval withdrawal of the right jugular catheter. Clinical correlation is recommended.   Electronically Signed   By: Inez Catalina M.D.   On: 10/28/2013 20:03   BMET    Component Value Date/Time   NA 136* 10/30/2013 0321   K 2.8* 10/30/2013 0321   CL 99 10/30/2013 0321   CO2 23 10/30/2013 0321   GLUCOSE 122* 10/30/2013 0321   BUN 26* 10/30/2013 0321   CREATININE 1.36* 10/30/2013 0321   CALCIUM 7.8* 10/30/2013 0321   GFRNONAA 40* 10/30/2013 0321   GFRAA 46* 10/30/2013 0321   CBC    Component Value Date/Time   WBC 14.9* 10/30/2013 0321   RBC 4.08  10/30/2013 0321   HGB 11.5* 10/30/2013 0321   HCT 33.6* 10/30/2013 0321   PLT 365 10/30/2013 0321   MCV 82.4 10/30/2013 0321   MCH 28.2 10/30/2013 0321   MCHC 34.2 10/30/2013 0321   RDW 15.6* 10/30/2013 0321   LYMPHSABS 2.2 10/30/2013 0321   MONOABS 1.9* 10/30/2013 0321   EOSABS 0.1 10/30/2013 0321   BASOSABS 0.1 10/30/2013 0321     Assessment: 1. AKI sec ATN from hypotension and contrast. Renal fx cont to improve towards baseline 2. Hyponatremia improved 3. SP gastric bypass and Lt transverse colectomy with colostomy 4. Hypotension, resolved 5. Hypokalemia 6. Hypomagnesemia Plan: 1. Her renal fx is almost back to baseline.  Not K replacement.  Will sign off.  Call if further renal issues. Bane Hagy T

## 2013-10-30 NOTE — Progress Notes (Signed)
6 Days Post-Op  Subjective: No complaints other than feeling weak  Objective: Vital signs in last 24 hours: Temp:  [98 F (36.7 C)-99.2 F (37.3 C)] 98.1 F (36.7 C) (06/13 0400) Pulse Rate:  [68-84] 72 (06/13 0600) Resp:  [9-24] 13 (06/13 0600) BP: (125-158)/(29-72) 158/66 mmHg (06/13 0600) SpO2:  [91 %-98 %] 96 % (06/13 0600) Last BM Date: 10/30/13  Intake/Output from previous day: 06/12 0701 - 06/13 0700 In: 1000 [P.O.:500; I.V.:300; IV Piggyback:200] Out: 2220 [Urine:1695; Drains:75; Stool:450] Intake/Output this shift:    Resp: clear to auscultation bilaterally Cardio: regular rate and rhythm GI: soft, mild tenderness. ostomy pink and productive  Lab Results:   Recent Labs  10/29/13 0325 10/30/13 0321  WBC 14.4* 14.9*  HGB 11.1* 11.5*  HCT 32.0* 33.6*  PLT 317 365   BMET  Recent Labs  10/29/13 0325 10/30/13 0321  NA 137 136*  K 2.9* 2.8*  CL 100 99  CO2 22 23  GLUCOSE 103* 122*  BUN 34* 26*  CREATININE 1.83* 1.36*  CALCIUM 7.4* 7.8*   PT/INR No results found for this basename: LABPROT, INR,  in the last 72 hours ABG No results found for this basename: PHART, PCO2, PO2, HCO3,  in the last 72 hours  Studies/Results: Dg Chest Port 1 View  10/28/2013   CLINICAL DATA:  Shortness of breath  EXAM: PORTABLE CHEST - 1 VIEW  COMPARISON:  10/24/2013  FINDINGS: Cardiac shadow is stable. The right jugular catheter has been withdrawn significantly an lies in the right innominate vein. Lungs are slightly improved in aeration. Bibasilar atelectatic changes are noted. The left basilar changes are new from the prior study.  IMPRESSION: New left basilar atelectasis.  Interval withdrawal of the right jugular catheter. Clinical correlation is recommended.   Electronically Signed   By: Inez Catalina M.D.   On: 10/28/2013 20:03    Anti-infectives: Anti-infectives   Start     Dose/Rate Route Frequency Ordered Stop   10/28/13 1830  cefTRIAXone (ROCEPHIN) 1 g in dextrose 5  % 50 mL IVPB     1 g 100 mL/hr over 30 Minutes Intravenous Every 24 hours 10/28/13 1759     10/19/13 0600  cefOXitin (MEFOXIN) 2 g in dextrose 5 % 50 mL IVPB     2 g 100 mL/hr over 30 Minutes Intravenous On call to O.R. 10/19/13 0534 10/19/13 1154      Assessment/Plan: s/p Procedure(s): LAPAROSCOPY DIAGNOSTIC, EXPLORATORY LAPAROTOMY, LEFT TRANSVERSE COLON RESECTION WITH COLOSTOMY, UPPER ENDOSCOPY (N/A) INSERTION CENTRAL LINE ADULT (Right) Advance diet. Start soft diet today Physical therapy consult Replace K and Mg  LOS: 11 days    TOTH III,PAUL S 10/30/2013

## 2013-10-30 NOTE — Evaluation (Signed)
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  Clinical Impression  Patient presents with decreased independence with mobility due to deficits listed in PT problem list.  She will benefit from skilled PT in the acute setting to allow return home with spouse assist and HHPT.  Needs OT consult as well.    Follow Up Recommendations Home health PT;Supervision/Assistance - 24 hour    Equipment Recommendations  None recommended by PT    Recommendations for Other Services OT consult     Precautions / Restrictions Precautions Precautions: Fall      Mobility  Bed Mobility Overal bed mobility: Needs Assistance Bed Mobility: Sit to Supine       Sit to supine: +2 for physical assistance;Max assist   General bed mobility comments: assist for legs into bed and to lower trunk for pain control  Transfers Overall transfer level: Needs assistance Equipment used: Rolling walker (2 wheeled) Transfers: Sit to/from Stand Sit to Stand: Mod assist         General transfer comment: lifting assist from recliner, increased time  Ambulation/Gait Ambulation/Gait assistance: Min assist Ambulation Distance (Feet): 70 Feet Assistive device: Rolling walker (2 wheeled) Gait Pattern/deviations: Step-through pattern;Shuffle;Decreased stride length     General Gait Details: slow pace, effortful and fatigues easily  Stairs            Wheelchair Mobility    Modified Rankin (Stroke Patients Only)       Balance Overall balance assessment: Needs assistance Sitting-balance support: Feet supported Sitting balance-Leahy Scale: Fair       Standing balance-Leahy Scale: Poor Standing balance comment: UE  support for balance with flexed posture with pain in abdomen at incision                             Pertinent Vitals/Pain 6/10 in incision    Home Living Family/patient expects to be discharged to:: Private residence Living Arrangements: Spouse/significant other Available Help at Discharge: Family;Available 24 hours/day Type of Home: House Home Access: Stairs to enter Entrance Stairs-Rails: None Entrance Stairs-Number of Steps: 2 Home Layout: One level Home Equipment: Environmental consultant - 2 wheels      Prior Function Level of Independence: Independent               Hand Dominance        Extremity/Trunk Assessment               Lower Extremity Assessment: RLE deficits/detail;LLE deficits/detail RLE Deficits / Details: AAROM WFL, hip flexion strength 3+/5 (pain in abdomen), knee extension 4-/5, ankle DF 4/5 LLE Deficits / Details: AAROM WFL, hip flexion strength 3+/5 (pain in abdomen), knee extension 4-/5, ankle DF 4/5     Communication   Communication: No difficulties  Cognition Arousal/Alertness: Awake/alert Behavior During Therapy: WFL for tasks assessed/performed Overall Cognitive Status: Within Functional Limits for tasks assessed                      General Comments      Exercises General Exercises - Lower Extremity Ankle Circles/Pumps: AROM;5 reps;Seated;Both Heel Slides: AAROM;Both;5 reps;Seated      Assessment/Plan    PT Assessment Patient needs continued PT services  PT Diagnosis Difficulty walking;Generalized weakness;Acute pain   PT Problem List Decreased strength;Decreased mobility;Pain;Decreased knowledge of use of DME;Decreased activity tolerance  PT Treatment Interventions DME instruction;Gait training;Stair training;Functional mobility training;Therapeutic activities;Patient/family education;Therapeutic exercise;Balance training   PT Goals (Current goals can be found in the Care Plan section) Acute Rehab PT Goals Patient  Stated Goal: To get stronger and go home PT Goal Formulation: With patient Time For Goal Achievement: 11/13/13 Potential to Achieve Goals: Good    Frequency Min 3X/week   Barriers to discharge        Co-evaluation               End of Session Equipment Utilized During Treatment: Gait belt Activity Tolerance: Patient limited by fatigue Patient left: in bed;with call bell/phone within reach           Time: 1545-1610 PT Time Calculation (min): 25 min   Charges:   PT Evaluation $Initial PT Evaluation Tier I: 1 Procedure PT Treatments $Gait Training: 8-22 mins   PT G Codes:          WYNN,CYNDI 11/08/2013, 4:20 PM Magda Kiel, [place] November 08, 2013

## 2013-10-31 LAB — GLUCOSE, CAPILLARY
GLUCOSE-CAPILLARY: 113 mg/dL — AB (ref 70–99)
Glucose-Capillary: 125 mg/dL — ABNORMAL HIGH (ref 70–99)
Glucose-Capillary: 111 mg/dL — ABNORMAL HIGH (ref 70–99)
Glucose-Capillary: 123 mg/dL — ABNORMAL HIGH (ref 70–99)
Glucose-Capillary: 103 mg/dL — ABNORMAL HIGH (ref 70–99)
Glucose-Capillary: 126 mg/dL — ABNORMAL HIGH (ref 70–99)

## 2013-10-31 LAB — MAGNESIUM: MAGNESIUM: 1.7 mg/dL (ref 1.5–2.5)

## 2013-10-31 MED ORDER — INSULIN ASPART 100 UNIT/ML ~~LOC~~ SOLN
0.0000 [IU] | Freq: Three times a day (TID) | SUBCUTANEOUS | Status: DC
  Administered 2013-11-03 – 2013-11-09 (×5): 3 [IU] via SUBCUTANEOUS

## 2013-10-31 MED ORDER — BIOTENE DRY MOUTH MT LIQD
15.0000 mL | Freq: Two times a day (BID) | OROMUCOSAL | Status: DC
  Administered 2013-11-02 – 2013-11-06 (×5): 15 mL via OROMUCOSAL

## 2013-10-31 NOTE — Progress Notes (Signed)
Report called to [place], Therapist, sports.  Patient transferred via wheelchair to room 1538.  Patient is alert and oriented.

## 2013-10-31 NOTE — Progress Notes (Signed)
7 Days Post-Op  Subjective: She feels a little better everyday  Objective: Vital signs in last 24 hours: Temp:  [97.9 F (36.6 C)-98.3 F (36.8 C)] 98.1 F (36.7 C) (06/14 0800) Pulse Rate:  [63-80] 63 (06/14 0810) Resp:  [12-18] 17 (06/14 0810) BP: (140-160)/(59-72) 140/62 mmHg (06/14 0810) SpO2:  [93 %-97 %] 97 % (06/14 0810) Weight:  [258 lb 9.6 oz (117.3 kg)] 258 lb 9.6 oz (117.3 kg) (06/14 0400) Last BM Date: 10/31/13  Intake/Output from previous day: 06/13 0701 - 06/14 0700 In: 170 [P.O.:120; IV Piggyback:50] Out: 2585 [Urine:2025; Drains:10; Stool:550] Intake/Output this shift: Total I/O In: -  Out: 90 [Stool:90]  Resp: clear to auscultation bilaterally Cardio: regular rate and rhythm GI: soft, minimal tenderness. ostomy pink and productive  Lab Results:   Recent Labs  10/29/13 0325 10/30/13 0321  WBC 14.4* 14.9*  HGB 11.1* 11.5*  HCT 32.0* 33.6*  PLT 317 365   BMET  Recent Labs  10/29/13 0325 10/30/13 0321  NA 137 136*  K 2.9* 2.8*  CL 100 99  CO2 22 23  GLUCOSE 103* 122*  BUN 34* 26*  CREATININE 1.83* 1.36*  CALCIUM 7.4* 7.8*   PT/INR No results found for this basename: LABPROT, INR,  in the last 72 hours ABG No results found for this basename: PHART, PCO2, PO2, HCO3,  in the last 72 hours  Studies/Results: No results found.  Anti-infectives: Anti-infectives   Start     Dose/Rate Route Frequency Ordered Stop   10/28/13 1830  cefTRIAXone (ROCEPHIN) 1 g in dextrose 5 % 50 mL IVPB     1 g 100 mL/hr over 30 Minutes Intravenous Every 24 hours 10/28/13 1759     10/19/13 0600  cefOXitin (MEFOXIN) 2 g in dextrose 5 % 50 mL IVPB     2 g 100 mL/hr over 30 Minutes Intravenous On call to O.R. 10/19/13 0534 10/19/13 1154      Assessment/Plan: s/p Procedure(s): LAPAROSCOPY DIAGNOSTIC, EXPLORATORY LAPAROTOMY, LEFT TRANSVERSE COLON RESECTION WITH COLOSTOMY, UPPER ENDOSCOPY (N/A) INSERTION CENTRAL LINE ADULT (Right) Advance diet. Pt now on soft  and tolerating well Transfer to floor Ambulate Continue abx  LOS: 12 days    TOTH III,Phylicia Mcgaugh S 10/31/2013

## 2013-11-01 LAB — CBC WITH DIFFERENTIAL/PLATELET
BASOS PCT: 1 % (ref 0–1)
Basophils Absolute: 0.2 10*3/uL — ABNORMAL HIGH (ref 0.0–0.1)
EOS PCT: 1 % (ref 0–5)
Eosinophils Absolute: 0.2 10*3/uL (ref 0.0–0.7)
HCT: 34.4 % — ABNORMAL LOW (ref 36.0–46.0)
HEMOGLOBIN: 11.7 g/dL — AB (ref 12.0–15.0)
Lymphocytes Relative: 16 % (ref 12–46)
Lymphs Abs: 3.5 10*3/uL (ref 0.7–4.0)
MCH: 28.4 pg (ref 26.0–34.0)
MCHC: 34 g/dL (ref 30.0–36.0)
MCV: 83.5 fL (ref 78.0–100.0)
MONO ABS: 1.7 10*3/uL — AB (ref 0.1–1.0)
MONOS PCT: 8 % (ref 3–12)
NEUTROS PCT: 74 % (ref 43–77)
Neutro Abs: 16.1 10*3/uL — ABNORMAL HIGH (ref 1.7–7.7)
Platelets: 416 10*3/uL — ABNORMAL HIGH (ref 150–400)
RBC: 4.12 MIL/uL (ref 3.87–5.11)
RDW: 16.1 % — ABNORMAL HIGH (ref 11.5–15.5)
WBC: 21.7 10*3/uL — ABNORMAL HIGH (ref 4.0–10.5)

## 2013-11-01 LAB — GLUCOSE, CAPILLARY
GLUCOSE-CAPILLARY: 112 mg/dL — AB (ref 70–99)
Glucose-Capillary: 106 mg/dL — ABNORMAL HIGH (ref 70–99)
Glucose-Capillary: 113 mg/dL — ABNORMAL HIGH (ref 70–99)
Glucose-Capillary: 113 mg/dL — ABNORMAL HIGH (ref 70–99)

## 2013-11-01 LAB — BASIC METABOLIC PANEL
BUN: 18 mg/dL (ref 6–23)
CALCIUM: 8.2 mg/dL — AB (ref 8.4–10.5)
CHLORIDE: 102 meq/L (ref 96–112)
CO2: 23 meq/L (ref 19–32)
Creatinine, Ser: 0.95 mg/dL (ref 0.50–1.10)
GFR, EST AFRICAN AMERICAN: 71 mL/min — AB (ref >90)
GFR, EST NON AFRICAN AMERICAN: 61 mL/min — AB (ref >90)
GLUCOSE: 124 mg/dL — AB (ref 70–99)
POTASSIUM: 3.2 meq/L — AB (ref 3.7–5.3)
Sodium: 138 mEq/L (ref 137–147)

## 2013-11-01 LAB — MAGNESIUM: Magnesium: 1.5 mg/dL (ref 1.5–2.5)

## 2013-11-01 NOTE — Progress Notes (Signed)
Verbal order from MD Hassell Done that wound to patient's abdomen may be packed with Iodoform 1/2 packing strips per Britta Mccreedy. Wound RN suggestion Neta Mends RN 11-01-2013 19:28pm

## 2013-11-01 NOTE — Progress Notes (Signed)
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    PT Comments    Pt limited by pain and c/o incr fatigue today; requiring decr assist overall with transfers and bed mobility  Follow Up Recommendations  Home health PT;Supervision/Assistance - 24 hour     Equipment Recommendations  None recommended by PT    Recommendations for Other Services OT consult     Precautions / Restrictions Precautions Precautions: Fall Precaution Comments: abd pain, JP drain    Mobility  Bed Mobility Overal bed mobility: Needs Assistance Bed Mobility: Supine to Sit     Supine to sit: Min assist     General bed mobility comments: assist for trunk due to pain  Transfers Overall transfer level: Needs assistance Equipment used: Rolling walker (2 wheeled) Transfers: Sit to/from Stand Sit to Stand: Min guard;Min assist         General transfer comment: requires incr assist from lower surface (toilet), cues for safety and control of descent  Ambulation/Gait Ambulation/Gait assistance: Min guard;Min assist Ambulation Distance (Feet): 15 Feet (times 2) Assistive device: Rolling walker (2 wheeled) Gait Pattern/deviations: Step-through pattern;Decreased stride length;Trunk flexed Gait velocity: decr   General Gait Details: slow pace, effortful and fatigues easily, VCs for RW position   Stairs            Wheelchair Mobility    Modified Rankin (Stroke Patients Only)       Balance             Standing balance-Leahy Scale: Poor                      Cognition Arousal/Alertness: Awake/alert Behavior During Therapy: WFL for tasks assessed/performed Overall Cognitive Status: Within  Functional Limits for tasks assessed                      Exercises General Exercises - Lower Extremity Ankle Circles/Pumps: AROM;5 reps;Seated;Both Quad Sets: AROM;Both;5 reps    General Comments        Pertinent Vitals/Pain 5-6/10 abd pain, Rn gave meds    Home Living                      Prior Function            PT Goals (current goals can now be found in the care plan section) Acute Rehab PT Goals Patient Stated Goal: To get stronger and go home PT Goal Formulation: With patient Time For Goal Achievement: 11/13/13 Potential to Achieve Goals: Good Progress towards PT goals: Progressing toward goals    Frequency  Min 3X/week    PT Plan Current plan remains appropriate    Co-evaluation             End of Session   Activity Tolerance: Patient limited by fatigue;Patient limited by pain Patient left: in chair;with call bell/phone within reach;with family/visitor present     Time: 1450-1508 PT Time Calculation (min): 18 min  Charges:  $Gait Training: 8-22 mins                    G Codes:      Thurston Brendlinger 11/09/13, 3:14 PM

## 2013-11-01 NOTE — Consult Note (Addendum)
WOC ostomy follow up Stoma type/location: RUQ Colostmy Stomal assessment/size: 1 and 1/2 inches round, moist, slightly budded Peristomal assessment: intact, clear Treatment options for stomal/peristomal skin: skin barrier ring to accommodate for minimal budding Output soft light brown stool Ostomy pouching: 2pc. 2 and 1/4 inch pouching system with skin barrier ring Education provided: pat asking appropriate questions about activity post discharge and is reassured about ability to increase activity as her condition as well as strength and duration, improves Enrolled patient in [employer] Discharge program: Yes [place] nursing team will follow,and will remain available to this patient, the nursing, surgical and medical teams.  Thanks, Maudie Flakes, MSN, RN, [place], [place], [place] [phone])

## 2013-11-01 NOTE — Progress Notes (Signed)
Patient alert and oriented.  Provided support and encouragement.  Encouraged pulmonary toilet, ambulation and small sips of liquids.  All questions answered.  Will continue to monitor. 

## 2013-11-01 NOTE — Progress Notes (Signed)
Patient ID: Carol Jackson, female   DOB: May 03, 1946, 68 y.o.   MRN: [id number] [facility] Progress Note:   8 Days Post-Op  Subjective: Mental status is clear Objective: Vital signs in last 24 hours: Temp:  [97.7 F (36.5 C)-98.4 F (36.9 C)] 98.4 F (36.9 C) (06/15 1000) Pulse Rate:  [69-75] 72 (06/15 1000) Resp:  [18-20] 18 (06/15 1000) BP: (147-166)/(69-87) 164/87 mmHg (06/15 1000) SpO2:  [94 %-100 %] 96 % (06/15 1000)  Intake/Output from previous day: 06/14 0701 - 06/15 0700 In: 170 [P.O.:120; IV Piggyback:50] Out: 2690 [Urine:1600; Drains:25; RKYHC:6237] Intake/Output this shift: Total I/O In: -  Out: 2 [Drains:2]  Physical Exam: Work of breathing is normal.  JP output is minimal.  Not infected appearing  Lab Results:  Results for orders placed during the hospital encounter of 10/19/13 (from the past 48 hour(s))  GLUCOSE, CAPILLARY     Status: Abnormal   Collection Time    10/30/13 12:10 PM      Result Value Ref Range   Glucose-Capillary 129 (*) 70 - 99 mg/dL  GLUCOSE, CAPILLARY     Status: None   Collection Time    10/30/13  3:49 PM      Result Value Ref Range   Glucose-Capillary 98  70 - 99 mg/dL  GLUCOSE, CAPILLARY     Status: Abnormal   Collection Time    10/30/13  8:11 PM      Result Value Ref Range   Glucose-Capillary 126 (*) 70 - 99 mg/dL  GLUCOSE, CAPILLARY     Status: Abnormal   Collection Time    10/31/13 12:10 AM      Result Value Ref Range   Glucose-Capillary 113 (*) 70 - 99 mg/dL  GLUCOSE, CAPILLARY     Status: Abnormal   Collection Time    10/31/13  4:09 AM      Result Value Ref Range   Glucose-Capillary 125 (*) 70 - 99 mg/dL  MAGNESIUM     Status: None   Collection Time    10/31/13  4:22 AM      Result Value Ref Range   Magnesium 1.7  1.5 - 2.5 mg/dL  GLUCOSE, CAPILLARY     Status: Abnormal   Collection Time    10/31/13  7:38 AM      Result Value Ref Range   Glucose-Capillary 111 (*) 70 - 99 mg/dL  GLUCOSE, CAPILLARY      Status: Abnormal   Collection Time    10/31/13 11:49 AM      Result Value Ref Range   Glucose-Capillary 123 (*) 70 - 99 mg/dL  GLUCOSE, CAPILLARY     Status: Abnormal   Collection Time    10/31/13  3:50 PM      Result Value Ref Range   Glucose-Capillary 103 (*) 70 - 99 mg/dL  GLUCOSE, CAPILLARY     Status: Abnormal   Collection Time    10/31/13  9:37 PM      Result Value Ref Range   Glucose-Capillary 126 (*) 70 - 99 mg/dL  MAGNESIUM     Status: None   Collection Time    11/01/13  5:17 AM      Result Value Ref Range   Magnesium 1.5  1.5 - 2.5 mg/dL  CBC WITH DIFFERENTIAL     Status: Abnormal   Collection Time    11/01/13  5:17 AM      Result Value Ref Range   WBC 21.7 (*) 4.0 -  10.5 K/uL   RBC 4.12  3.87 - 5.11 MIL/uL   Hemoglobin 11.7 (*) 12.0 - 15.0 g/dL   HCT 34.4 (*) 36.0 - 46.0 %   MCV 83.5  78.0 - 100.0 fL   MCH 28.4  26.0 - 34.0 pg   MCHC 34.0  30.0 - 36.0 g/dL   RDW 16.1 (*) 11.5 - 15.5 %   Platelets 416 (*) 150 - 400 K/uL   Neutrophils Relative % 74  43 - 77 %   Lymphocytes Relative 16  12 - 46 %   Monocytes Relative 8  3 - 12 %   Eosinophils Relative 1  0 - 5 %   Basophils Relative 1  0 - 1 %   Neutro Abs 16.1 (*) 1.7 - 7.7 K/uL   Lymphs Abs 3.5  0.7 - 4.0 K/uL   Monocytes Absolute 1.7 (*) 0.1 - 1.0 K/uL   Eosinophils Absolute 0.2  0.0 - 0.7 K/uL   Basophils Absolute 0.2 (*) 0.0 - 0.1 K/uL   RBC Morphology POLYCHROMASIA PRESENT     WBC Morphology       Value: MODERATE LEFT SHIFT (>5% METAS AND MYELOS,OCC PRO NOTED)   Comment: TOXIC GRANULATION     VACUOLATED NEUTROPHILS  BASIC METABOLIC PANEL     Status: Abnormal   Collection Time    11/01/13  5:17 AM      Result Value Ref Range   Sodium 138  137 - 147 mEq/L   Potassium 3.2 (*) 3.7 - 5.3 mEq/L   Chloride 102  96 - 112 mEq/L   CO2 23  19 - 32 mEq/L   Glucose, Bld 124 (*) 70 - 99 mg/dL   BUN 18  6 - 23 mg/dL   Creatinine, Ser 0.95  0.50 - 1.10 mg/dL   Calcium 8.2 (*) 8.4 - 10.5 mg/dL   GFR calc  non Af Amer 61 (*) >90 mL/min   GFR calc Af Amer 71 (*) >90 mL/min   Comment: (NOTE)     The eGFR has been calculated using the CKD EPI equation.     This calculation has not been validated in all clinical situations.     eGFR's persistently <90 mL/min signify possible Chronic Kidney     Disease.  GLUCOSE, CAPILLARY     Status: Abnormal   Collection Time    11/01/13  7:35 AM      Result Value Ref Range   Glucose-Capillary 112 (*) 70 - 99 mg/dL    Radiology/Results: No results found.  Anti-infectives: Anti-infectives   Start     Dose/Rate Route Frequency Ordered Stop   10/28/13 1830  cefTRIAXone (ROCEPHIN) 1 g in dextrose 5 % 50 mL IVPB     1 g 100 mL/hr over 30 Minutes Intravenous Every 24 hours 10/28/13 1759     10/19/13 0600  cefOXitin (MEFOXIN) 2 g in dextrose 5 % 50 mL IVPB     2 g 100 mL/hr over 30 Minutes Intravenous On call to O.R. 10/19/13 0534 10/19/13 1154      Assessment/Plan: Problem List: Patient Active Problem List   Diagnosis Date Noted  . 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 S/P gastric bypass 10/19/2013  . Diabetes 09/29/2013  . Thoracotomy scar of right chest-decortication at [facility] 09/29/2013  . 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 Obesity, unspecified 10/22/2012    WBC is up today.  Temp normal.  JP ready to come out.  Not draining. Plan:  Repeat CBC in am.  Discontinue JP.  8 Days Post-Op    LOS: 13 days   Matt B. Hassell Done, MD, [facility] [phone] beeper [phone]  11/01/2013 10:34 AM

## 2013-11-02 ENCOUNTER — Inpatient Hospital Stay (HOSPITAL_COMMUNITY): Payer: Medicare Other

## 2013-11-02 ENCOUNTER — Encounter: Payer: Medicare Other | Attending: Surgery

## 2013-11-02 DIAGNOSIS — Z01818 Encounter for other preprocedural examination: Secondary | ICD-10-CM | POA: Insufficient documentation

## 2013-11-02 DIAGNOSIS — Z713 Dietary counseling and surveillance: Secondary | ICD-10-CM | POA: Insufficient documentation

## 2013-11-02 LAB — CBC WITH DIFFERENTIAL/PLATELET
BASOS PCT: 1 % (ref 0–1)
Basophils Absolute: 0.3 10*3/uL — ABNORMAL HIGH (ref 0.0–0.1)
Eosinophils Absolute: 0.3 10*3/uL (ref 0.0–0.7)
Eosinophils Relative: 1 % (ref 0–5)
HCT: 33.5 % — ABNORMAL LOW (ref 36.0–46.0)
HEMOGLOBIN: 11.2 g/dL — AB (ref 12.0–15.0)
LYMPHS ABS: 3.5 10*3/uL (ref 0.7–4.0)
Lymphocytes Relative: 14 % (ref 12–46)
MCH: 28 pg (ref 26.0–34.0)
MCHC: 33.4 g/dL (ref 30.0–36.0)
MCV: 83.8 fL (ref 78.0–100.0)
MONO ABS: 1.8 10*3/uL — AB (ref 0.1–1.0)
Monocytes Relative: 7 % (ref 3–12)
NEUTROS ABS: 19.4 10*3/uL — AB (ref 1.7–7.7)
Neutrophils Relative %: 77 % (ref 43–77)
Platelets: 432 10*3/uL — ABNORMAL HIGH (ref 150–400)
RBC: 4 MIL/uL (ref 3.87–5.11)
RDW: 16.2 % — ABNORMAL HIGH (ref 11.5–15.5)
WBC: 25.3 10*3/uL — ABNORMAL HIGH (ref 4.0–10.5)

## 2013-11-02 LAB — GLUCOSE, CAPILLARY
GLUCOSE-CAPILLARY: 112 mg/dL — AB (ref 70–99)
GLUCOSE-CAPILLARY: 114 mg/dL — AB (ref 70–99)
Glucose-Capillary: 103 mg/dL — ABNORMAL HIGH (ref 70–99)
Glucose-Capillary: 105 mg/dL — ABNORMAL HIGH (ref 70–99)

## 2013-11-02 NOTE — Progress Notes (Signed)
Nutrition Education Note  Received consult for diet education per DROP protocol.   Patient is on a CHO MOD diet with small portions and tolerating well.   Educated patient and husband on bariatric diet along with adjustment's for colostomy.  Discussed protein needs of approximately 100 gm daily currently.  Likes Premier Protein drinks as well as Unjury.  Emphasized that liquids must be non carbonated, non caffeinated, and sugar free. Fluid goals discussed. [facility].  Patient to follow up with outpatient bariatric RD for further diet progression after discharge. Multivitamins and minerals also reviewed. Teach back method used, pt expressed understanding, expect good compliance.  Questions answered and office number provided.  Antonieta Iba, RD, LDN Clinical Inpatient Dietitian Pager:  [phone] Weekend and after hours pager:  [phone]

## 2013-11-02 NOTE — Progress Notes (Signed)
Patient ID: Carol Jackson, female   DOB: 11/16/1945, 68 y.o.   MRN: [id number] [facility] Progress Note:   9 Days Post-Op  Subjective: Mental status is clear.  No complaints Objective: Vital signs in last 24 hours: Temp:  [98 F (36.7 C)-98.6 F (37 C)] 98.2 F (36.8 C) (06/16 1400) Pulse Rate:  [65-72] 70 (06/16 1400) Resp:  [16-18] 18 (06/16 1400) BP: (146-159)/(76-86) 159/76 mmHg (06/16 1400) SpO2:  [92 %-96 %] 95 % (06/16 1400)  Intake/Output from previous day: 06/15 0701 - 06/16 0700 In: 290 [P.O.:240; IV Piggyback:50] Out: 2222 [INOMV:6720; Drains:2; Stool:270] Intake/Output this shift: Total I/O In: 360 [P.O.:360] Out: 1050 [Urine:750; Stool:300]  Physical Exam: Work of breathing is normal.  Abdominal wound has purulent drainage from the top.  Opened more.  ?source of elevated WBC  Lab Results:  Results for orders placed during the hospital encounter of 10/19/13 (from the past 48 hour(s))  GLUCOSE, CAPILLARY     Status: Abnormal   Collection Time    10/31/13  3:50 PM      Result Value Ref Range   Glucose-Capillary 103 (*) 70 - 99 mg/dL  GLUCOSE, CAPILLARY     Status: Abnormal   Collection Time    10/31/13  9:37 PM      Result Value Ref Range   Glucose-Capillary 126 (*) 70 - 99 mg/dL  MAGNESIUM     Status: None   Collection Time    11/01/13  5:17 AM      Result Value Ref Range   Magnesium 1.5  1.5 - 2.5 mg/dL  CBC WITH DIFFERENTIAL     Status: Abnormal   Collection Time    11/01/13  5:17 AM      Result Value Ref Range   WBC 21.7 (*) 4.0 - 10.5 K/uL   RBC 4.12  3.87 - 5.11 MIL/uL   Hemoglobin 11.7 (*) 12.0 - 15.0 g/dL   HCT 34.4 (*) 36.0 - 46.0 %   MCV 83.5  78.0 - 100.0 fL   MCH 28.4  26.0 - 34.0 pg   MCHC 34.0  30.0 - 36.0 g/dL   RDW 16.1 (*) 11.5 - 15.5 %   Platelets 416 (*) 150 - 400 K/uL   Neutrophils Relative % 74  43 - 77 %   Lymphocytes Relative 16  12 - 46 %   Monocytes Relative 8  3 - 12 %   Eosinophils Relative 1  0 - 5 %    Basophils Relative 1  0 - 1 %   Neutro Abs 16.1 (*) 1.7 - 7.7 K/uL   Lymphs Abs 3.5  0.7 - 4.0 K/uL   Monocytes Absolute 1.7 (*) 0.1 - 1.0 K/uL   Eosinophils Absolute 0.2  0.0 - 0.7 K/uL   Basophils Absolute 0.2 (*) 0.0 - 0.1 K/uL   RBC Morphology POLYCHROMASIA PRESENT     WBC Morphology       Value: MODERATE LEFT SHIFT (>5% METAS AND MYELOS,OCC PRO NOTED)   Comment: TOXIC GRANULATION     VACUOLATED NEUTROPHILS  BASIC METABOLIC PANEL     Status: Abnormal   Collection Time    11/01/13  5:17 AM      Result Value Ref Range   Sodium 138  137 - 147 mEq/L   Potassium 3.2 (*) 3.7 - 5.3 mEq/L   Chloride 102  96 - 112 mEq/L   CO2 23  19 - 32 mEq/L   Glucose, Bld 124 (*) 70 -  99 mg/dL   BUN 18  6 - 23 mg/dL   Creatinine, Ser 0.95  0.50 - 1.10 mg/dL   Calcium 8.2 (*) 8.4 - 10.5 mg/dL   GFR calc non Af Amer 61 (*) >90 mL/min   GFR calc Af Amer 71 (*) >90 mL/min   Comment: (NOTE)     The eGFR has been calculated using the CKD EPI equation.     This calculation has not been validated in all clinical situations.     eGFR's persistently <90 mL/min signify possible Chronic Kidney     Disease.  GLUCOSE, CAPILLARY     Status: Abnormal   Collection Time    11/01/13  7:35 AM      Result Value Ref Range   Glucose-Capillary 112 (*) 70 - 99 mg/dL  GLUCOSE, CAPILLARY     Status: Abnormal   Collection Time    11/01/13 11:56 AM      Result Value Ref Range   Glucose-Capillary 106 (*) 70 - 99 mg/dL  GLUCOSE, CAPILLARY     Status: Abnormal   Collection Time    11/01/13  4:32 PM      Result Value Ref Range   Glucose-Capillary 113 (*) 70 - 99 mg/dL  GLUCOSE, CAPILLARY     Status: Abnormal   Collection Time    11/01/13  9:04 PM      Result Value Ref Range   Glucose-Capillary 113 (*) 70 - 99 mg/dL  CBC WITH DIFFERENTIAL     Status: Abnormal   Collection Time    11/02/13  4:40 AM      Result Value Ref Range   WBC 25.3 (*) 4.0 - 10.5 K/uL   RBC 4.00  3.87 - 5.11 MIL/uL   Hemoglobin 11.2 (*)  12.0 - 15.0 g/dL   HCT 33.5 (*) 36.0 - 46.0 %   MCV 83.8  78.0 - 100.0 fL   MCH 28.0  26.0 - 34.0 pg   MCHC 33.4  30.0 - 36.0 g/dL   RDW 16.2 (*) 11.5 - 15.5 %   Platelets 432 (*) 150 - 400 K/uL   Neutrophils Relative % 77  43 - 77 %   Lymphocytes Relative 14  12 - 46 %   Monocytes Relative 7  3 - 12 %   Eosinophils Relative 1  0 - 5 %   Basophils Relative 1  0 - 1 %   Neutro Abs 19.4 (*) 1.7 - 7.7 K/uL   Lymphs Abs 3.5  0.7 - 4.0 K/uL   Monocytes Absolute 1.8 (*) 0.1 - 1.0 K/uL   Eosinophils Absolute 0.3  0.0 - 0.7 K/uL   Basophils Absolute 0.3 (*) 0.0 - 0.1 K/uL   RBC Morphology POLYCHROMASIA PRESENT     Comment: BURR CELLS   WBC Morphology MILD LEFT SHIFT (1-5% METAS, OCC MYELO, OCC BANDS)    GLUCOSE, CAPILLARY     Status: Abnormal   Collection Time    11/02/13  7:44 AM      Result Value Ref Range   Glucose-Capillary 112 (*) 70 - 99 mg/dL  GLUCOSE, CAPILLARY     Status: Abnormal   Collection Time    11/02/13 11:42 AM      Result Value Ref Range   Glucose-Capillary 103 (*) 70 - 99 mg/dL    Radiology/Results: Dg Chest 2 View  11/02/2013   CLINICAL DATA:  Leukocytosis, history hypertension, diabetes, obesity  EXAM: CHEST  2 VIEW  COMPARISON:  10/28/2013  FINDINGS: Mild  enlargement of cardiac silhouette.  Mediastinal contours and pulmonary vascularity normal.  Bibasilar atelectasis.  Retrocardiac LEFT lower lobe consolidation persists.  Upper lungs clear.  No pleural effusion or pneumothorax.  Bones unremarkable.  IMPRESSION: Low lung volumes with bibasilar atelectasis and persistent retrocardiac LEFT lower lobe consolidation.  Enlargement of cardiac silhouette.   Electronically Signed   By: Lavonia Dana M.D.   On: 11/02/2013 11:19    Anti-infectives: Anti-infectives   Start     Dose/Rate Route Frequency Ordered Stop   10/28/13 1830  cefTRIAXone (ROCEPHIN) 1 g in dextrose 5 % 50 mL IVPB     1 g 100 mL/hr over 30 Minutes Intravenous Every 24 hours 10/28/13 1759     10/19/13  0600  cefOXitin (MEFOXIN) 2 g in dextrose 5 % 50 mL IVPB     2 g 100 mL/hr over 30 Minutes Intravenous On call to O.R. 10/19/13 0534 10/19/13 1154      Assessment/Plan: Problem List: Patient Active Problem List   Diagnosis Date Noted  . 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 S/P gastric bypass 10/19/2013  . Diabetes 09/29/2013  . Thoracotomy scar of right chest-decortication at [facility] 09/29/2013  . Post-pancreatectomy diabetes-splenectomy at [facility] 09/29/2013  . 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 Obesity, unspecified 10/22/2012    CXR showed LLL atelectasis-encourage pulmonary toilet 9 Days Post-Op    LOS: 14 days   Matt B. Hassell Done, MD, [facility] [phone] beeper [phone]  11/02/2013 3:39 PM

## 2013-11-03 ENCOUNTER — Encounter (HOSPITAL_COMMUNITY): Payer: Self-pay | Admitting: Radiology

## 2013-11-03 ENCOUNTER — Ambulatory Visit (HOSPITAL_COMMUNITY): Payer: Medicare Other

## 2013-11-03 LAB — CBC WITH DIFFERENTIAL/PLATELET
Basophils Absolute: 0.2 10*3/uL — ABNORMAL HIGH (ref 0.0–0.1)
Basophils Relative: 1 % (ref 0–1)
Eosinophils Absolute: 0.2 10*3/uL (ref 0.0–0.7)
Eosinophils Relative: 1 % (ref 0–5)
HCT: 32.6 % — ABNORMAL LOW (ref 36.0–46.0)
Hemoglobin: 11 g/dL — ABNORMAL LOW (ref 12.0–15.0)
Lymphocytes Relative: 14 % (ref 12–46)
Lymphs Abs: 3.4 10*3/uL (ref 0.7–4.0)
MCH: 28.2 pg (ref 26.0–34.0)
MCHC: 33.7 g/dL (ref 30.0–36.0)
MCV: 83.6 fL (ref 78.0–100.0)
MONOS PCT: 7 % (ref 3–12)
Monocytes Absolute: 1.7 10*3/uL — ABNORMAL HIGH (ref 0.1–1.0)
NEUTROS ABS: 19.1 10*3/uL — AB (ref 1.7–7.7)
Neutrophils Relative %: 77 % (ref 43–77)
Platelets: 421 10*3/uL — ABNORMAL HIGH (ref 150–400)
RBC: 3.9 MIL/uL (ref 3.87–5.11)
RDW: 16.3 % — AB (ref 11.5–15.5)
WBC: 24.6 10*3/uL — ABNORMAL HIGH (ref 4.0–10.5)

## 2013-11-03 LAB — GLUCOSE, CAPILLARY
GLUCOSE-CAPILLARY: 122 mg/dL — AB (ref 70–99)
Glucose-Capillary: 100 mg/dL — ABNORMAL HIGH (ref 70–99)
Glucose-Capillary: 108 mg/dL — ABNORMAL HIGH (ref 70–99)
Glucose-Capillary: 135 mg/dL — ABNORMAL HIGH (ref 70–99)

## 2013-11-03 MED ORDER — IOHEXOL 300 MG/ML  SOLN: 25.0000 mL | Freq: Once | INTRAMUSCULAR | Status: AC | PRN

## 2013-11-03 NOTE — Progress Notes (Signed)
Patient alert and oriented.  Provided support and encouragement.  Encouraged pulmonary toilet, ambulation and small sips of liquids.  All questions answered.  Will continue to monitor. 

## 2013-11-03 NOTE — Progress Notes (Signed)
Patient ID: Carol Jackson, female   DOB: 05-21-1945, 68 y.o.   MRN: [id number] [facility] Surgery Progress Note:   10 Days Post-Op  Subjective: Mental status is clear.  No complaints Objective: Vital signs in last 24 hours: Temp:  [98 F (36.7 C)-98.7 F (37.1 C)] 98.2 F (36.8 C) (06/17 1400) Pulse Rate:  [73-77] 75 (06/17 1400) Resp:  [16-18] 18 (06/17 1400) BP: (144-167)/(72-85) 164/85 mmHg (06/17 1400) SpO2:  [93 %-97 %] 96 % (06/17 1400)  Intake/Output from previous day: 06/16 0701 - 06/17 0700 In: 360 [P.O.:360] Out: 3800 [Urine:3350; Stool:450] Intake/Output this shift: Total I/O In: 240 [P.O.:240] Out: 1550 [Urine:800; Stool:750]  Physical Exam: Work of breathing is normal.  Wound has more foul drainage.  Noted on CT scan.    Lab Results:  Results for orders placed during the hospital encounter of 10/19/13 (from the past 48 hour(s))  GLUCOSE, CAPILLARY     Status: Abnormal   Collection Time    11/01/13  9:04 PM      Result Value Ref Range   Glucose-Capillary 113 (*) 70 - 99 mg/dL  CBC WITH DIFFERENTIAL     Status: Abnormal   Collection Time    11/02/13  4:40 AM      Result Value Ref Range   WBC 25.3 (*) 4.0 - 10.5 K/uL   RBC 4.00  3.87 - 5.11 MIL/uL   Hemoglobin 11.2 (*) 12.0 - 15.0 g/dL   HCT 33.5 (*) 36.0 - 46.0 %   MCV 83.8  78.0 - 100.0 fL   MCH 28.0  26.0 - 34.0 pg   MCHC 33.4  30.0 - 36.0 g/dL   RDW 16.2 (*) 11.5 - 15.5 %   Platelets 432 (*) 150 - 400 K/uL   Neutrophils Relative % 77  43 - 77 %   Lymphocytes Relative 14  12 - 46 %   Monocytes Relative 7  3 - 12 %   Eosinophils Relative 1  0 - 5 %   Basophils Relative 1  0 - 1 %   Neutro Abs 19.4 (*) 1.7 - 7.7 K/uL   Lymphs Abs 3.5  0.7 - 4.0 K/uL   Monocytes Absolute 1.8 (*) 0.1 - 1.0 K/uL   Eosinophils Absolute 0.3  0.0 - 0.7 K/uL   Basophils Absolute 0.3 (*) 0.0 - 0.1 K/uL   RBC Morphology POLYCHROMASIA PRESENT     Comment: BURR CELLS   WBC Morphology MILD LEFT SHIFT (1-5% METAS,  OCC MYELO, OCC BANDS)    GLUCOSE, CAPILLARY     Status: Abnormal   Collection Time    11/02/13  7:44 AM      Result Value Ref Range   Glucose-Capillary 112 (*) 70 - 99 mg/dL  GLUCOSE, CAPILLARY     Status: Abnormal   Collection Time    11/02/13 11:42 AM      Result Value Ref Range   Glucose-Capillary 103 (*) 70 - 99 mg/dL  GLUCOSE, CAPILLARY     Status: Abnormal   Collection Time    11/02/13  4:57 PM      Result Value Ref Range   Glucose-Capillary 105 (*) 70 - 99 mg/dL  GLUCOSE, CAPILLARY     Status: Abnormal   Collection Time    11/02/13  9:33 PM      Result Value Ref Range   Glucose-Capillary 114 (*) 70 - 99 mg/dL  CBC WITH DIFFERENTIAL     Status: Abnormal   Collection Time  11/03/13  4:20 AM      Result Value Ref Range   WBC 24.6 (*) 4.0 - 10.5 K/uL   RBC 3.90  3.87 - 5.11 MIL/uL   Hemoglobin 11.0 (*) 12.0 - 15.0 g/dL   HCT 32.6 (*) 36.0 - 46.0 %   MCV 83.6  78.0 - 100.0 fL   MCH 28.2  26.0 - 34.0 pg   MCHC 33.7  30.0 - 36.0 g/dL   RDW 16.3 (*) 11.5 - 15.5 %   Platelets 421 (*) 150 - 400 K/uL   Neutrophils Relative % 77  43 - 77 %   Lymphocytes Relative 14  12 - 46 %   Monocytes Relative 7  3 - 12 %   Eosinophils Relative 1  0 - 5 %   Basophils Relative 1  0 - 1 %   Neutro Abs 19.1 (*) 1.7 - 7.7 K/uL   Lymphs Abs 3.4  0.7 - 4.0 K/uL   Monocytes Absolute 1.7 (*) 0.1 - 1.0 K/uL   Eosinophils Absolute 0.2  0.0 - 0.7 K/uL   Basophils Absolute 0.2 (*) 0.0 - 0.1 K/uL   WBC Morphology MILD LEFT SHIFT (1-5% METAS, OCC MYELO, OCC BANDS)    GLUCOSE, CAPILLARY     Status: Abnormal   Collection Time    11/03/13  8:01 AM      Result Value Ref Range   Glucose-Capillary 122 (*) 70 - 99 mg/dL  GLUCOSE, CAPILLARY     Status: Abnormal   Collection Time    11/03/13 12:15 PM      Result Value Ref Range   Glucose-Capillary 100 (*) 70 - 99 mg/dL  GLUCOSE, CAPILLARY     Status: Abnormal   Collection Time    11/03/13  4:46 PM      Result Value Ref Range   Glucose-Capillary  108 (*) 70 - 99 mg/dL    Radiology/Results: Ct Abdomen Pelvis Wo Contrast  11/03/2013   CLINICAL DATA:  Elevated white blood cell count  EXAM: CT ABDOMEN AND PELVIS WITHOUT CONTRAST  TECHNIQUE: Multidetector CT imaging of the abdomen and pelvis was performed following the standard protocol without IV contrast.  COMPARISON:  10/22/2013  FINDINGS: Lung bases demonstrate evidence of a small left pleural effusion with associated atelectasis/consolidation. This is stable from the prior exam.  The liver and spleen are within normal limits. The gallbladder demonstrates dependent density which may be related to vicarious excretion of contrast material. The adrenal glands and pancreas are stable from the prior study. Postsurgical changes are seen within the stomach. The hepatic flexure has now been drawn to the right anterior abdominal consistent with a colostomy. The previously seen obstructive changes have resolved the kidneys are well visualized and demonstrate a normal pattern.  Scanning into the pelvis shows the appendix to be within normal limits. No free pelvic fluid is seen. The bladder is partially distended. Postsurgical changes are noted in the anterior abdominal wall small areas noted just below the umbilicus best seen on image number 55 which likely represents a small fluid collection. It is new from the prior exam. No obstructive changes are seen.  IMPRESSION: Postsurgical changes with new right-sided colostomy. No obstructive changes are seen.  Stable left effusion and left lower lobe consolidation.  Postsurgical changes in the anterior abdominal wall with small areas of fluid as described above.   Electronically Signed   By: Inez Catalina M.D.   On: 11/03/2013 12:47   Dg Chest 2 View  11/02/2013  CLINICAL DATA:  Leukocytosis, history hypertension, diabetes, obesity  EXAM: CHEST  2 VIEW  COMPARISON:  10/28/2013  FINDINGS: Mild enlargement of cardiac silhouette.  Mediastinal contours and pulmonary  vascularity normal.  Bibasilar atelectasis.  Retrocardiac LEFT lower lobe consolidation persists.  Upper lungs clear.  No pleural effusion or pneumothorax.  Bones unremarkable.  IMPRESSION: Low lung volumes with bibasilar atelectasis and persistent retrocardiac LEFT lower lobe consolidation.  Enlargement of cardiac silhouette.   Electronically Signed   By: Lavonia Dana M.D.   On: 11/02/2013 11:19    Anti-infectives: Anti-infectives   Start     Dose/Rate Route Frequency Ordered Stop   10/28/13 1830  cefTRIAXone (ROCEPHIN) 1 g in dextrose 5 % 50 mL IVPB     1 g 100 mL/hr over 30 Minutes Intravenous Every 24 hours 10/28/13 1759     10/19/13 0600  cefOXitin (MEFOXIN) 2 g in dextrose 5 % 50 mL IVPB     2 g 100 mL/hr over 30 Minutes Intravenous On call to O.R. 10/19/13 0534 10/19/13 1154      Assessment/Plan: Problem List: Patient Active Problem List   Diagnosis Date Noted  . 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 S/P gastric bypass 10/19/2013  . Diabetes 09/29/2013  . Thoracotomy scar of right chest-decortication at [facility] 09/29/2013  . Post-pancreatectomy diabetes-splenectomy at [facility] 09/29/2013  . 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 Obesity, unspecified 10/22/2012    Will check CBC in am.  Wound infection is draining.   10 Days Post-Op    LOS: 15 days   Matt B. Hassell Done, MD, [facility] [phone] beeper [phone]  11/03/2013 5:03 PM

## 2013-11-03 NOTE — Progress Notes (Signed)
Patient is alert and oriented, vital signs are stable, midline abdominal incision is draining foul smelling purulent drainage in copious amounts, dressing changed to midline abdomen several times this shift, colostomy changed as well, stoma is beefy red, patient medicated with oxycodone and morphine 4 mg throughout the shift, patient is tolerating her diet without complaints of nausea or vomiting, patient up in chair during shift and walking with hallway with walker and 1 person assist, will continue to monitor patient Neta Mends RN 11-03-2013 17:33pm

## 2013-11-03 NOTE — Progress Notes (Signed)
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    PT Comments    *Good progress with mobility. She walked 180' with RW with supervision. **  Follow Up Recommendations  Home health PT;Supervision/Assistance - 24 hour     Equipment Recommendations  None recommended by PT    Recommendations for Other Services OT consult     Precautions / Restrictions Precautions Precautions: Fall Precaution Comments: abd pain, JP drain Restrictions Weight Bearing Restrictions: No    Mobility  Bed Mobility Overal bed mobility: Modified Independent Bed Mobility: Supine to Sit     Supine to sit: Modified independent (Device/Increase time)     General bed mobility comments: HOB up, used rail; verbally instructed pt to attempt log roll next time she gets up  Transfers Overall transfer level: Needs assistance Equipment used: Rolling walker (2 wheeled) Transfers: Sit to/from Stand Sit to Stand: Modified independent (Device/Increase time)         General transfer comment: from bed with use of BUEs to push off  Ambulation/Gait Ambulation/Gait assistance: Supervision Ambulation Distance (Feet): 180 Feet Assistive device: Rolling walker (2 wheeled) Gait Pattern/deviations: Step-through pattern Gait velocity: WFL   General Gait Details: good positioning in RW, increased distance   Stairs            Wheelchair Mobility    Modified Rankin (Stroke Patients Only)       Balance     Sitting balance-Leahy Scale: Good       Standing balance-Leahy Scale: Fair                      Cognition Arousal/Alertness: Awake/alert Behavior During Therapy: WFL for tasks  assessed/performed Overall Cognitive Status: Within Functional Limits for tasks assessed                      Exercises      General Comments        Pertinent Vitals/Pain *pt reported, "no pain, just sore" at incision site**    Home Living                      Prior Function            PT Goals (current goals can now be found in the care plan section) Acute Rehab PT Goals Patient Stated Goal: To get stronger and go home PT Goal Formulation: With patient Time For Goal Achievement: 11/13/13 Potential to Achieve Goals: Good Progress towards PT goals: Progressing toward goals    Frequency  Min 3X/week    PT Plan Current plan remains appropriate    Co-evaluation             End of Session Equipment Utilized During Treatment: Gait belt Activity Tolerance: Patient tolerated treatment well Patient left: in chair;with call bell/phone within reach;with family/visitor present     Time: 9826-4158 PT Time Calculation (min): 13 min  Charges:  $Gait Training: 8-22 mins                    G Codes:      Philomena Doheny 11/03/2013, 1:04 PM [phone]

## 2013-11-04 LAB — CBC WITH DIFFERENTIAL/PLATELET
Basophils Absolute: 0 10*3/uL (ref 0.0–0.1)
Basophils Relative: 0 % (ref 0–1)
EOS ABS: 0.2 10*3/uL (ref 0.0–0.7)
Eosinophils Relative: 1 % (ref 0–5)
HCT: 32.4 % — ABNORMAL LOW (ref 36.0–46.0)
Hemoglobin: 11 g/dL — ABNORMAL LOW (ref 12.0–15.0)
LYMPHS ABS: 3.1 10*3/uL (ref 0.7–4.0)
Lymphocytes Relative: 16 % (ref 12–46)
MCH: 28.5 pg (ref 26.0–34.0)
MCHC: 34 g/dL (ref 30.0–36.0)
MCV: 83.9 fL (ref 78.0–100.0)
MONO ABS: 1.6 10*3/uL — AB (ref 0.1–1.0)
MONOS PCT: 8 % (ref 3–12)
Neutro Abs: 14.5 10*3/uL — ABNORMAL HIGH (ref 1.7–7.7)
Neutrophils Relative %: 75 % (ref 43–77)
PLATELETS: 447 10*3/uL — AB (ref 150–400)
RBC: 3.86 MIL/uL — ABNORMAL LOW (ref 3.87–5.11)
RDW: 16.5 % — ABNORMAL HIGH (ref 11.5–15.5)
WBC: 19.4 10*3/uL — AB (ref 4.0–10.5)

## 2013-11-04 LAB — GLUCOSE, CAPILLARY
Glucose-Capillary: 116 mg/dL — ABNORMAL HIGH (ref 70–99)
Glucose-Capillary: 107 mg/dL — ABNORMAL HIGH (ref 70–99)
Glucose-Capillary: 112 mg/dL — ABNORMAL HIGH (ref 70–99)
Glucose-Capillary: 138 mg/dL — ABNORMAL HIGH (ref 70–99)

## 2013-11-05 LAB — CBC WITH DIFFERENTIAL/PLATELET
BASOS PCT: 0 % (ref 0–1)
Basophils Absolute: 0 10*3/uL (ref 0.0–0.1)
EOS ABS: 0.3 10*3/uL (ref 0.0–0.7)
EOS PCT: 2 % (ref 0–5)
HEMATOCRIT: 32.3 % — AB (ref 36.0–46.0)
HEMOGLOBIN: 10.9 g/dL — AB (ref 12.0–15.0)
Lymphocytes Relative: 22 % (ref 12–46)
Lymphs Abs: 3.7 10*3/uL (ref 0.7–4.0)
MCH: 28.5 pg (ref 26.0–34.0)
MCHC: 33.7 g/dL (ref 30.0–36.0)
MCV: 84.3 fL (ref 78.0–100.0)
MONO ABS: 1.3 10*3/uL — AB (ref 0.1–1.0)
Monocytes Relative: 8 % (ref 3–12)
NEUTROS ABS: 11.4 10*3/uL — AB (ref 1.7–7.7)
Neutrophils Relative %: 68 % (ref 43–77)
Platelets: 463 10*3/uL — ABNORMAL HIGH (ref 150–400)
RBC: 3.83 MIL/uL — AB (ref 3.87–5.11)
RDW: 16.6 % — ABNORMAL HIGH (ref 11.5–15.5)
WBC: 16.7 10*3/uL — ABNORMAL HIGH (ref 4.0–10.5)

## 2013-11-05 LAB — GLUCOSE, CAPILLARY
GLUCOSE-CAPILLARY: 83 mg/dL (ref 70–99)
GLUCOSE-CAPILLARY: 120 mg/dL — AB (ref 70–99)
Glucose-Capillary: 122 mg/dL — ABNORMAL HIGH (ref 70–99)
Glucose-Capillary: 92 mg/dL (ref 70–99)

## 2013-11-05 NOTE — Progress Notes (Signed)
PT Cancellation Note  ___Treatment cancelled today due to medical issues with patient which prohibited therapy  ___ Treatment cancelled today due to patient receiving procedure or test   ___ Treatment cancelled today due to patient's refusal to participate   _X_ Treatment cancelled today due to colostomy bag leaking at inner seal.  RN called to room.  Pt promised to amb in hallway later this evening with spouse.  Rica Koyanagi  PTA [facility]  Acute  Rehab Pager      [phone]

## 2013-11-06 LAB — GLUCOSE, CAPILLARY
Glucose-Capillary: 122 mg/dL — ABNORMAL HIGH (ref 70–99)
Glucose-Capillary: 110 mg/dL — ABNORMAL HIGH (ref 70–99)
Glucose-Capillary: 110 mg/dL — ABNORMAL HIGH (ref 70–99)
Glucose-Capillary: 115 mg/dL — ABNORMAL HIGH (ref 70–99)

## 2013-11-06 NOTE — Progress Notes (Signed)
Patient ID: Carol Jackson, female   DOB: Jan 12, 1946, 68 y.o.   MRN: [id number] [facility] Surgery Progress Note:   13 Days Post-Op  Subjective: Mental status is clear.   Objective: Vital signs in last 24 hours: Temp:  [98 F (36.7 C)-98.6 F (37 C)] 98.5 F (36.9 C) (06/20 0452) Pulse Rate:  [54-67] 54 (06/20 0452) Resp:  [16-18] 16 (06/20 0452) BP: (146-180)/(72-84) 156/82 mmHg (06/20 0540) SpO2:  [91 %-96 %] 94 % (06/20 0452)  Intake/Output from previous day: 06/19 0701 - 06/20 0700 In: 360 [P.O.:360] Out: 2585 [Urine:1500; Stool:1085] Intake/Output this shift:    Physical Exam: Work of breathing is normal.  Still having fair amount of drainage from upper part of wound.    Lab Results:  Results for orders placed during the hospital encounter of 10/19/13 (from the past 48 hour(s))  GLUCOSE, CAPILLARY     Status: Abnormal   Collection Time    11/04/13 12:05 PM      Result Value Ref Range   Glucose-Capillary 107 (*) 70 - 99 mg/dL  GLUCOSE, CAPILLARY     Status: Abnormal   Collection Time    11/04/13  4:39 PM      Result Value Ref Range   Glucose-Capillary 112 (*) 70 - 99 mg/dL  GLUCOSE, CAPILLARY     Status: Abnormal   Collection Time    11/04/13  9:17 PM      Result Value Ref Range   Glucose-Capillary 138 (*) 70 - 99 mg/dL  CBC WITH DIFFERENTIAL     Status: Abnormal   Collection Time    11/05/13  4:24 AM      Result Value Ref Range   WBC 16.7 (*) 4.0 - 10.5 K/uL   RBC 3.83 (*) 3.87 - 5.11 MIL/uL   Hemoglobin 10.9 (*) 12.0 - 15.0 g/dL   HCT 32.3 (*) 36.0 - 46.0 %   MCV 84.3  78.0 - 100.0 fL   MCH 28.5  26.0 - 34.0 pg   MCHC 33.7  30.0 - 36.0 g/dL   RDW 16.6 (*) 11.5 - 15.5 %   Platelets 463 (*) 150 - 400 K/uL   Neutrophils Relative % 68  43 - 77 %   Lymphocytes Relative 22  12 - 46 %   Monocytes Relative 8  3 - 12 %   Eosinophils Relative 2  0 - 5 %   Basophils Relative 0  0 - 1 %   Neutro Abs 11.4 (*) 1.7 - 7.7 K/uL   Lymphs Abs 3.7  0.7 - 4.0  K/uL   Monocytes Absolute 1.3 (*) 0.1 - 1.0 K/uL   Eosinophils Absolute 0.3  0.0 - 0.7 K/uL   Basophils Absolute 0.0  0.0 - 0.1 K/uL   RBC Morphology POLYCHROMASIA PRESENT    GLUCOSE, CAPILLARY     Status: Abnormal   Collection Time    11/05/13  7:45 AM      Result Value Ref Range   Glucose-Capillary 122 (*) 70 - 99 mg/dL   Comment 1 Documented in Chart     Comment 2 Notify RN    GLUCOSE, CAPILLARY     Status: None   Collection Time    11/05/13 11:39 AM      Result Value Ref Range   Glucose-Capillary 83  70 - 99 mg/dL   Comment 1 Documented in Chart     Comment 2 Notify RN    GLUCOSE, CAPILLARY     Status: None  Collection Time    11/05/13  4:42 PM      Result Value Ref Range   Glucose-Capillary 92  70 - 99 mg/dL  GLUCOSE, CAPILLARY     Status: Abnormal   Collection Time    11/05/13 10:01 PM      Result Value Ref Range   Glucose-Capillary 120 (*) 70 - 99 mg/dL    Radiology/Results: No results found.  Anti-infectives: Anti-infectives   Start     Dose/Rate Route Frequency Ordered Stop   10/28/13 1830  cefTRIAXone (ROCEPHIN) 1 g in dextrose 5 % 50 mL IVPB     1 g 100 mL/hr over 30 Minutes Intravenous Every 24 hours 10/28/13 1759     10/19/13 0600  cefOXitin (MEFOXIN) 2 g in dextrose 5 % 50 mL IVPB     2 g 100 mL/hr over 30 Minutes Intravenous On call to O.R. 10/19/13 0534 10/19/13 1154      Assessment/Plan: Problem List: Patient Active Problem List   Diagnosis Date Noted  . 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 S/P gastric bypass 10/19/2013  . Diabetes 09/29/2013  . Thoracotomy scar of right chest-[facility]ication at [facility] 09/29/2013  . Post-pancreatectomy diabetes-splenectomy at [facility] 09/29/2013  . 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 Obesity, unspecified 10/22/2012    Will recheck WBC tomorrow 13 Days Post-Op    LOS: 18 days   Matt B. Hassell Done, MD, [facility] [phone]  beeper [phone]  11/06/2013 7:46 AM

## 2013-11-07 LAB — CBC WITH DIFFERENTIAL/PLATELET
BASOS ABS: 0.1 10*3/uL (ref 0.0–0.1)
Basophils Relative: 1 % (ref 0–1)
EOS ABS: 0.2 10*3/uL (ref 0.0–0.7)
EOS PCT: 2 % (ref 0–5)
HEMATOCRIT: 36 % (ref 36.0–46.0)
Hemoglobin: 11.9 g/dL — ABNORMAL LOW (ref 12.0–15.0)
LYMPHS PCT: 25 % (ref 12–46)
Lymphs Abs: 3.3 10*3/uL (ref 0.7–4.0)
MCH: 28.4 pg (ref 26.0–34.0)
MCHC: 33.1 g/dL (ref 30.0–36.0)
MCV: 85.9 fL (ref 78.0–100.0)
MONO ABS: 1.3 10*3/uL — AB (ref 0.1–1.0)
Monocytes Relative: 9 % (ref 3–12)
Neutro Abs: 8.5 10*3/uL — ABNORMAL HIGH (ref 1.7–7.7)
Neutrophils Relative %: 63 % (ref 43–77)
Platelets: 519 10*3/uL — ABNORMAL HIGH (ref 150–400)
RBC: 4.19 MIL/uL (ref 3.87–5.11)
RDW: 16.9 % — AB (ref 11.5–15.5)
WBC: 13.4 10*3/uL — AB (ref 4.0–10.5)

## 2013-11-07 LAB — GLUCOSE, CAPILLARY
GLUCOSE-CAPILLARY: 96 mg/dL (ref 70–99)
Glucose-Capillary: 114 mg/dL — ABNORMAL HIGH (ref 70–99)
Glucose-Capillary: 87 mg/dL (ref 70–99)
Glucose-Capillary: 120 mg/dL — ABNORMAL HIGH (ref 70–99)

## 2013-11-07 NOTE — Progress Notes (Signed)
Patient ID: Carol Jackson, female   DOB: [birth year], 68 y.o.   MRN: [id number] [facility] Surgery Progress Note:   14 Days Post-Op  Subjective: Mental status is clear.  Sitting up taking po without complaints Objective: Vital signs in last 24 hours: Temp:  [97.6 F (36.4 C)-98.7 F (37.1 C)] 97.9 F (36.6 C) (06/21 0602) Pulse Rate:  [55-64] 55 (06/21 0602) Resp:  [16-18] 16 (06/21 0602) BP: (160-166)/(77-81) 166/81 mmHg (06/21 0602) SpO2:  [92 %-94 %] 92 % (06/21 0602)  Intake/Output from previous day: 06/20 0701 - 06/21 0700 In: 400 [P.O.:200; IV Piggyback:200] Out: 3250 [Urine:2250; Stool:1000] Intake/Output this shift:    Physical Exam: Work of breathing is normal.  No abdominal pain.    Lab Results:  Results for orders placed during the hospital encounter of 10/19/13 (from the past 48 hour(s))  GLUCOSE, CAPILLARY     Status: None   Collection Time    11/05/13 11:39 AM      Result Value Ref Range   Glucose-Capillary 83  70 - 99 mg/dL   Comment 1 Documented in Chart     Comment 2 Notify RN    GLUCOSE, CAPILLARY     Status: None   Collection Time    11/05/13  4:42 PM      Result Value Ref Range   Glucose-Capillary 92  70 - 99 mg/dL  GLUCOSE, CAPILLARY     Status: Abnormal   Collection Time    11/05/13 10:01 PM      Result Value Ref Range   Glucose-Capillary 120 (*) 70 - 99 mg/dL  GLUCOSE, CAPILLARY     Status: Abnormal   Collection Time    11/06/13  7:58 AM      Result Value Ref Range   Glucose-Capillary 122 (*) 70 - 99 mg/dL  GLUCOSE, CAPILLARY     Status: Abnormal   Collection Time    11/06/13 12:51 PM      Result Value Ref Range   Glucose-Capillary 110 (*) 70 - 99 mg/dL  GLUCOSE, CAPILLARY     Status: Abnormal   Collection Time    11/06/13  5:19 PM      Result Value Ref Range   Glucose-Capillary 110 (*) 70 - 99 mg/dL  GLUCOSE, CAPILLARY     Status: Abnormal   Collection Time    11/06/13  9:28 PM      Result Value Ref Range   Glucose-Capillary 115 (*) 70 - 99 mg/dL  CBC WITH DIFFERENTIAL     Status: Abnormal   Collection Time    11/07/13  5:44 AM      Result Value Ref Range   WBC 13.4 (*) 4.0 - 10.5 K/uL   RBC 4.19  3.87 - 5.11 MIL/uL   Hemoglobin 11.9 (*) 12.0 - 15.0 g/dL   HCT 36.0  36.0 - 46.0 %   MCV 85.9  78.0 - 100.0 fL   MCH 28.4  26.0 - 34.0 pg   MCHC 33.1  30.0 - 36.0 g/dL   RDW 16.9 (*) 11.5 - 15.5 %   Platelets 519 (*) 150 - 400 K/uL   Neutrophils Relative % 63  43 - 77 %   Neutro Abs 8.5 (*) 1.7 - 7.7 K/uL   Lymphocytes Relative 25  12 - 46 %   Lymphs Abs 3.3  0.7 - 4.0 K/uL   Monocytes Relative 9  3 - 12 %   Monocytes Absolute 1.3 (*) 0.1 - 1.0 K/uL  Eosinophils Relative 2  0 - 5 %   Eosinophils Absolute 0.2  0.0 - 0.7 K/uL   Basophils Relative 1  0 - 1 %   Basophils Absolute 0.1  0.0 - 0.1 K/uL  GLUCOSE, CAPILLARY     Status: Abnormal   Collection Time    11/07/13  7:55 AM      Result Value Ref Range   Glucose-Capillary 114 (*) 70 - 99 mg/dL    Radiology/Results: No results found.  Anti-infectives: Anti-infectives   Start     Dose/Rate Route Frequency Ordered Stop   10/28/13 1830  cefTRIAXone (ROCEPHIN) 1 g in dextrose 5 % 50 mL IVPB     1 g 100 mL/hr over 30 Minutes Intravenous Every 24 hours 10/28/13 1759     10/19/13 0600  cefOXitin (MEFOXIN) 2 g in dextrose 5 % 50 mL IVPB     2 g 100 mL/hr over 30 Minutes Intravenous On call to O.R. 10/19/13 0534 10/19/13 1154      Assessment/Plan: Problem List: Patient Active Problem List   Diagnosis Date Noted  . 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 S/P gastric bypass 10/19/2013  . Diabetes 09/29/2013  . Thoracotomy scar of right chest-decortication at [facility] 09/29/2013  . Post-pancreatectomy diabetes-splenectomy at [facility] 09/29/2013  . 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 Obesity, unspecified 10/22/2012    WBC is down.  Will ask ostomy nurses to see if wound VAC could expedite  healing of upper part of wound.   Hopeful discharge on Tuesday.  14 Days Post-Op    LOS: 19 days   Matt B. Hassell Done, MD, [facility] [phone] beeper [phone]  11/07/2013 8:33 AM

## 2013-11-07 NOTE — Consult Note (Addendum)
WOC wound consult note Reason for Consult: NPWT application following consultation with Dr. Hassell Done earlier today. Wound type:Surgical Pressure Ulcer POA: No Measurement:16cm closely approximated incision with 4 open areas and one area covered by a thin layer of yellow slough.  The most proximal area measures 0.4cm round and 6cm deep, distal to that is a 2cm x 1.5cm x 6cm opening. Distal to that is a 0.5cm opening measuring 0.4cm round with a 0.5cm depth and distal to that is a 0.5cm round opening with 6cm depth. None of the wounds communicate beneath the skin level.  The most distal area is covered with thin eschar and measures 2cm x 2.5cm. Wound KLK:JZPHXT to visualize wound bed. Drainage (amount, consistency, odor) Moderate to large amount of serosanguinous mixed with tan exudate. Mild odor. Periwound:intact, clear Dressing procedure/placement/frequency:NPWT initiated after first placing drape around the wound periphery. Black foam narrow "wicks" are inserted into the wounds with depth and solid pieces of black foam are place along the top of the incision.  Ten (10) small pieces of foam are used and this is noted on the exterior of the dressing.  Drape is applied over all foams and T.R.A.C. Pad is attached.  Negative pressure is achieved without difficulty and is maintained at 131mmHg continuous pressure and tolerated well.   WOC ostomy follow up Stoma type/location: RUQ Colostomy Stomal assessment/size: 1 and 1/4 inches moist, slightly budded. Oval at rest, round with mild traction applied to 12 o'clock Peristomal assessment: Intact, clear Treatment options for stomal/peristomal skin: Skin barrier ring Output Liquid with stool flecks Ostomy pouching: 2pc.,2 and 1/4 inch pouching system with skin barrier ring. Pouch Lawson # [phone]. Skin barrier, Kellie Simmering [phone].  Skin barrier ring, Kellie Simmering [phone] Education provided: Patient received 4mg  MSO4 prior to application of NPWT and is ready to rest.  I will  work with her tomorrow on pouch emptying. Enrolled patient in [place] Start Discharge program: Yes  [place] nursing team will not follow, but will remain available to this patient, the nursing and medical team.  Please re-consult if needed. Thanks, Maudie Flakes, MSN, RN, [place], [place], [place] [phone])

## 2013-11-08 LAB — GLUCOSE, CAPILLARY
GLUCOSE-CAPILLARY: 128 mg/dL — AB (ref 70–99)
GLUCOSE-CAPILLARY: 108 mg/dL — AB (ref 70–99)
GLUCOSE-CAPILLARY: 95 mg/dL (ref 70–99)
Glucose-Capillary: 116 mg/dL — ABNORMAL HIGH (ref 70–99)

## 2013-11-08 NOTE — Progress Notes (Signed)
PT Cancellation Note  Patient Details Name: Carol Jackson MRN: [id number] DOB: 01-21-1946   Cancelled Treatment:    Reason Eval/Treat Not Completed: Other (comment) (Pt stated she walked the full loop around the unit this morning with nursing and is now having lunch. ) Will follow.    [name] 11/08/2013, 2:03 PM [phone]

## 2013-11-08 NOTE — Progress Notes (Signed)
Patient ID: Carol Jackson, female   DOB: Mar 23, 1946, 68 y.o.   MRN: [id number] [facility] Progress Note:   15 Days Post-Op  Subjective: Mental status is clear  Objective: Vital signs in last 24 hours: Temp:  [97.4 F (36.3 C)-98.2 F (36.8 C)] 97.4 F (36.3 C) (06/22 1331) Pulse Rate:  [51-97] 56 (06/22 1331) Resp:  [18-20] 20 (06/22 1331) BP: (136-184)/(54-83) 164/76 mmHg (06/22 1331) SpO2:  [91 %-99 %] 97 % (06/22 1331)  Intake/Output from previous day: 06/21 0701 - 06/22 0700 In: 250 [P.O.:200; IV Piggyback:50] Out: 2550 [Urine:1850; Stool:700] Intake/Output this shift: Total I/O In: 240 [P.O.:240] Out: 750 [Urine:500; Stool:250]  Physical Exam: Work of breathing is normal VAC applied  Lab Results:  Results for orders placed during the hospital encounter of 10/19/13 (from the past 48 hour(s))  GLUCOSE, CAPILLARY     Status: Abnormal   Collection Time    11/06/13  5:19 PM      Result Value Ref Range   Glucose-Capillary 110 (*) 70 - 99 mg/dL  GLUCOSE, CAPILLARY     Status: Abnormal   Collection Time    11/06/13  9:28 PM      Result Value Ref Range   Glucose-Capillary 115 (*) 70 - 99 mg/dL  CBC WITH DIFFERENTIAL     Status: Abnormal   Collection Time    11/07/13  5:44 AM      Result Value Ref Range   WBC 13.4 (*) 4.0 - 10.5 K/uL   RBC 4.19  3.87 - 5.11 MIL/uL   Hemoglobin 11.9 (*) 12.0 - 15.0 g/dL   HCT 36.0  36.0 - 46.0 %   MCV 85.9  78.0 - 100.0 fL   MCH 28.4  26.0 - 34.0 pg   MCHC 33.1  30.0 - 36.0 g/dL   RDW 16.9 (*) 11.5 - 15.5 %   Platelets 519 (*) 150 - 400 K/uL   Neutrophils Relative % 63  43 - 77 %   Neutro Abs 8.5 (*) 1.7 - 7.7 K/uL   Lymphocytes Relative 25  12 - 46 %   Lymphs Abs 3.3  0.7 - 4.0 K/uL   Monocytes Relative 9  3 - 12 %   Monocytes Absolute 1.3 (*) 0.1 - 1.0 K/uL   Eosinophils Relative 2  0 - 5 %   Eosinophils Absolute 0.2  0.0 - 0.7 K/uL   Basophils Relative 1  0 - 1 %   Basophils Absolute 0.1  0.0 - 0.1 K/uL   GLUCOSE, CAPILLARY     Status: Abnormal   Collection Time    11/07/13  7:55 AM      Result Value Ref Range   Glucose-Capillary 114 (*) 70 - 99 mg/dL  GLUCOSE, CAPILLARY     Status: None   Collection Time    11/07/13 12:24 PM      Result Value Ref Range   Glucose-Capillary 96  70 - 99 mg/dL  GLUCOSE, CAPILLARY     Status: None   Collection Time    11/07/13  4:28 PM      Result Value Ref Range   Glucose-Capillary 87  70 - 99 mg/dL  GLUCOSE, CAPILLARY     Status: Abnormal   Collection Time    11/07/13  9:48 PM      Result Value Ref Range   Glucose-Capillary 120 (*) 70 - 99 mg/dL  GLUCOSE, CAPILLARY     Status: Abnormal   Collection Time    11/08/13  7:21 AM      Result Value Ref Range   Glucose-Capillary 128 (*) 70 - 99 mg/dL  GLUCOSE, CAPILLARY     Status: Abnormal   Collection Time    11/08/13 11:23 AM      Result Value Ref Range   Glucose-Capillary 108 (*) 70 - 99 mg/dL    Radiology/Results: No results found.  Anti-infectives: Anti-infectives   Start     Dose/Rate Route Frequency Ordered Stop   10/28/13 1830  cefTRIAXone (ROCEPHIN) 1 g in dextrose 5 % 50 mL IVPB     1 g 100 mL/hr over 30 Minutes Intravenous Every 24 hours 10/28/13 1759     10/19/13 0600  cefOXitin (MEFOXIN) 2 g in dextrose 5 % 50 mL IVPB     2 g 100 mL/hr over 30 Minutes Intravenous On call to O.R. 10/19/13 0534 10/19/13 1154      Assessment/Plan: Problem List: Patient Active Problem List   Diagnosis Date Noted  . 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 S/P gastric bypass 10/19/2013  . Diabetes 09/29/2013  . Thoracotomy scar of right chest-decortication at [facility] 09/29/2013  . Post-pancreatectomy diabetes-splenectomy at [facility] 09/29/2013  . 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 Obesity, unspecified 10/22/2012    Hopeful dischage tomorrow 15 Days Post-Op    LOS: 20 days   Matt B. Hassell Done, MD, [facility] [phone]  beeper [phone]  11/08/2013 2:30 PM

## 2013-11-08 NOTE — Care Management Note (Signed)
    Page 1 of 2   11/09/2013     11:38:43 AM CARE MANAGEMENT NOTE 11/09/2013  Patient:  Carol Jackson,Carol Jackson   Account Number:  [id number]  Date Initiated:  10/25/2013  Documentation initiated by:  DAVIS,RHONDA  Subjective/Objective Assessment:   pt with complication from lap band had intial surg to removed and lap roux en y done on 06022015/pt required second surg due to colonic obstruction on 06072015-colstomy with formation of a hartman pouch/hypotensive post op and placed in icu.     Action/Plan:   tbd based on progression s/p second surgical intervention.   Anticipated DC Date:  11/10/2013   Anticipated DC Plan:  [place]  In-house referral  NA      DC Planning Services  CM consult      [facility] Choice  HOME HEALTH  DURABLE MEDICAL EQUIPMENT   Choice offered to / List presented to:  C-1 Patient   DME arranged  VAC      DME agency  [facility]     [place] arranged  HH-1 RN  [place] PT      [place].   Status of service:  Completed, signed off Medicare Important Message given?  YES (If response is "NO", the following Medicare IM given date fields will be blank) Date Medicare IM given:  11/09/2013 Date Additional Medicare IM given:    Discharge Disposition:    Per UR Regulation:  Reviewed for med. necessity/level of care/duration of stay  If discussed at [place] of Stay Meetings, dates discussed:    Comments:  11-08-13 Sunday Spillers RN CM [phone] Application and orders along with supporting documents faxed to [facility] Voicemail left for Ricki.  1100 Spoke with patient at bedside. Discussed d/c plans, wishes to return home with [facility] services. Provided list for [facility] choice, no preference. Contacted [facility] to provide [facility] services. Awaiting final orders. Wound Vac application awaiting physician signature.  [place], RN, BSN, CCM [phone] Chart Reviewed for discharge and hospital needs. Discharge needs at time of review: None  present will follow for needs. Review of patient progress due on [id number].   [id number] Rosana Hoes RN, BSN, [place] [phone] Chart Reviewed for discharge and hospital needs. Discharge needs at time of review: None present will follow for needs. Review of patient progress due on [id number].

## 2013-11-09 ENCOUNTER — Ambulatory Visit (INDEPENDENT_AMBULATORY_CARE_PROVIDER_SITE_OTHER): Payer: Self-pay | Admitting: Surgery

## 2013-11-09 DIAGNOSIS — Z933 Colostomy status: Secondary | ICD-10-CM

## 2013-11-09 LAB — GLUCOSE, CAPILLARY: GLUCOSE-CAPILLARY: 130 mg/dL — AB (ref 70–99)

## 2013-11-09 MED ORDER — HYDROCODONE-ACETAMINOPHEN 7.5-325 MG/15ML PO SOLN: 15.0000 mL | ORAL | Status: DC | PRN

## 2013-11-09 NOTE — Progress Notes (Signed)
Physical Therapy Discharge Patient Details Name: Carol Jackson MRN: [id number] DOB: September 14, 1945 Today's Date: 11/09/2013 Time:  -     Patient discharged from PT services secondary to goals met and no further PT needs identified.  Please see latest therapy progress note for current level of functioning and progress toward goals.    Progress and discharge plan discussed with patient and/or caregiver: Patient/Caregiver agrees with plan  Pt is walking 400' with RW in halls independently 2x/day. No further PT needs.   GP     [name] 11/09/2013, 8:42 AM  [phone]

## 2013-11-09 NOTE — Discharge Summary (Signed)
Physician Discharge Summary  Patient ID: Carol Jackson MRN: [id number] DOB/AGE: 67-Sep-1947 70 y.o.  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  Lap gastric bypass did well for 2 days until symptoms of colon obstruction manifest.  Subsequent exploratory lap addressed the divided colon where it had been attached to the posterior stomach and was divided when the gastric pouch was created.  This required colostomy in the right upper quadrant and a long Hartmann pouch.  Wound complications necessitated her going home with a wound VAC.    Consults: critical care medicine  Significant Diagnostic Studies: CT scans    Discharge Exam: Blood pressure 161/87, pulse 77, temperature 98.2 F (36.8 C), temperature source Oral, resp. rate 18, height 5\' 6"  (1.676 m), weight 258 lb 9.6 oz (117.3 kg), SpO2 91.00%. Ostomy in place;  VAC controlling drainage from upper incision.    Disposition:   Discharge Instructions   Call MD for:  persistant nausea and vomiting    Complete by:  As directed      Call MD for:  severe uncontrolled pain    Complete by:  As directed      Diet Carb Modified    Complete by:  As directed      Discharge instructions    Complete by:  As directed   Wound VAC per home heatlh     Discharge wound care:    Complete by:  As directed   Per home wound VAC     Increase activity slowly    Complete by:  As directed             Medication List    STOP taking these medications       acetaminophen-codeine  300-30 MG per tablet  Commonly known as:  TYLENOL #3     aspirin 81 MG tablet      TAKE these medications       amLODipine 10 MG tablet  Commonly known as:  NORVASC  Take 10 mg by mouth every evening.     glucose blood test strip  1 each by Other route as needed for other. Use as instructed     HYDROcodone-acetaminophen 7.5-325 mg/15 ml solution  Commonly known as:  HYCET  Take 15 mLs by mouth every 4 (four) hours as needed for moderate pain.     IRON 21/7 PO  Take 27 mg by mouth daily.     Krill Oil Omega-3 300 MG Caps  Take 300 mg by mouth daily.     Lancets Misc  by Does not apply route.     levothyroxine 50 MCG tablet  Commonly known as:  SYNTHROID, LEVOTHROID  Take 50 mcg by mouth daily before breakfast.     loratadine 10 MG tablet  Commonly known as:  CLARITIN  Take 10 mg by mouth daily.     metFORMIN 500 MG tablet  Commonly known as:  GLUCOPHAGE  Take 500 mg by mouth 2 (two) times daily with a meal.     omeprazole 20 MG capsule  Commonly known  as:  PRILOSEC  Take 20 mg by mouth 2 (two) times daily before a meal.     potassium chloride 20 MEQ packet  Commonly known as:  KLOR-CON  Take 20 mEq by mouth daily.     simvastatin 40 MG tablet  Commonly known as:  ZOCOR  Take 40 mg by mouth every evening.     Vitamin B-12 2500 MCG Subl  Place 2,500 mcg under the tongue every evening.     Vitamin D-3 1000 UNITS Caps  Take 1,000 Units by mouth daily.           Follow-up Information   Follow up with [place]. (RN, physical therapy)    Contact information:   [address] [place] [address] [phone]       Follow up with Pedro Earls, MD.   Specialty:  General Surgery   Contact information:   [address] [place] [address] [phone]       Follow up with Rubie Maid, MD.   Specialty:  Family Medicine   Contact information:   [address] [place]  [address] [phone]       Signed: Pedro Earls 11/09/2013, 9:57 AM

## 2013-11-09 NOTE — Progress Notes (Signed)
Discharge instructions reviewed with patient, patient connected to portable wound vac and educated on wound vac, advanced home health care to come to patient's house on Wednesday to change wound vac, patient is tolerating her current diet and bariatric shakes, patient educated on emptying colostomy and colostomy care, questions and concerns answered Neta Mends RN 11-09-2013 12:11pm

## 2013-11-09 NOTE — Discharge Instructions (Signed)
Vacuum-Assisted Closure Therapy Vacuum-assisted closure (VAC) therapy uses a device that removes fluid and germs from wounds to help them heal. It is used on wounds that cannot be closed with stitches. They often heal slowly. Vacuum-assisted therapy helps the wound stay clean and healthy while the open wound slowly grows back together. Vacuum-assisted closure therapy uses a bandage (dressing) that is made of foam. It is put inside the wound. Then, a drape is placed over the wound. This drape sticks to your skin to keep air out, and to protect the wound. A tube is hooked up to a small pump and is attached to the drape. The pump sucks out the fluid and germs. Vacuum-assisted closure therapy can also help reduce the bad smell that comes from the wound. HOW DOES IT WORK?  The vacuum pump pulls fluid through the foam dressing. The dressing may wrinkle during this process. The fluid goes into the tube and away from the wound. The fluid then goes into a container. The fluid in the container must be replaced if it is full or at least once a week, even if the container is not full. The pulling from the pump helps to close the wound and bring better circulation to the wound area. The foam dressing covers and protects the wound. It helps your wound heal faster.  HOW DOES IT FEEL?   You might feel a little pulling when the pump is on.  You might also feel a mild vibrating sensation.  You might feel some discomfort when the dressing is taken off. CAN I MOVE AROUND WITH VACUUM-ASSISTED CLOSURE THERAPY? Yes, it has a backup battery which is used when the machine is not plugged in, as long as the battery is working, you can move freely. WHAT ARE SOME THINGS I MUST KNOW?  Do not turn off the pump yourself, unless instructed to do so by your healthcare provider, such as for bathing.  Do not take off the dressing yourself, unless instructed to do so by your caregiver.  You can wash or shower with the dressing.  However, do not take the pump into the shower. Make sure the wound dressing is protected and covered with plastic. The wound area must stay dry.  Do not turn off the pump for more than 2 hours. If the pump is off for more than 2 hours, your nurse must change your dressing.  Check frequently that the machine is on, that the machine indicates the therapy is on, and that all clamps are open. THE ALARM IS SOUNDING! WHAT SHOULD I DO?   Stay calm.  Do not turn off the pump or do anything with the dressing.  Call your clinic or caregiver right away if the alarm goes off and you cannot fix the problem. Some reasons the alarm might go off include:  The fluid collection container is full.  The battery is low.  The dressing has a leak.  Explain to your caregiver what is happening. Follow the instructions you receive. WHEN SHOULD I CALL FOR HELP?   You have severe pain.  You have difficulty breathing.  You have bleeding that will not stop.  Your wound smells bad.  You have redness, swelling, or fluid leaking from your wound.  Your alarm goes off and you do not know what to do.  You have a fever.  Your wound itches severely.  Your dressing changes are often painful or bleeding often occurs.  You have diarrhea.  You have a sore throat.  You have a rash around the dressing or anywhere else on your body.  You feel nauseous.  You feel dizzy or weak.  The [facility] machine has been off for more than 2 hours. HOW DO I GET READY TO GO HOME WITH A PUMP?  A trained caregiver will talk to you and answer your questions about your vacuum-assisted closure therapy before you go home. He or she will explain what to expect. A caregiver will come to your home to apply the pump and care for your wound. The at-home caregiver will be available for questions and will come back for the scheduled dressing changes, usually every 48-72 hours (or more often for severely infected wounds). Your at-home  caregiver will also come if you are having an unexpected problem. If you have questions or do not know what to do when you go home, talk to your healthcare provider. Document Released: 04/18/2008 Document Revised: 01/06/2013 Document Reviewed: 04/19/2011 [facility] Patient Information 2015 [place], [place]. This information is not intended to replace advice given to you by your health care provider. Make sure you discuss any questions you have with your health care provider.

## 2013-11-09 NOTE — Consult Note (Signed)
WOC ostomy follow up Stoma type/location: RUQ Colostomy  Stomal assessment/size: 1 and 1/4 inches, oval at rest, round with traction applied at 12 o'clock Peristomal assessment: Not seen today. Treatment options for stomal/peristomal skin: N/A Output Increasing thickness Ostomy pouching: 2pc. Pouching system (2 adn 14 inches) with skin barrier ring. Education provided: Extended session with patient and husband to answer any remaining questions.  Provided information regarding showering, VAC changes, pouch change schedule, emptying tips using toilet paper "wick", odor management and lifestyle integration. Supply acquisition post discharge discussed and I have reviewed the role and scope of [employer].  For the first time this admission, husband does not leave room while I am visiting.  Patient is satisfied with answers and is looking forward to discharge later today.  Dr. Hassell Done in during my visit and he will see her in about 7-10 days in the office. Enrolled patient in [facility] Discharge program: Yes. They have already made contact with patient. Patient is ready for discharge from a [place] perspective. We will remain available. Thanks, Maudie Flakes, MSN, RN, [place], [place], [place] [phone])

## 2013-11-09 NOTE — Progress Notes (Signed)
Patient alert and oriented, pain is controlled. Patient is tolerating fluids, as well as soft protein.  Reviewed Gastric Bypass discharge instructions with patient and patient is able to articulate understanding. Provided information on BELT program, Support Group and [facility] outpatient pharmacy. All questions answered, will continue to monitor.  Provided Gastric Bypass Diet instructions from 2 week to 2 months post-op.

## 2013-11-10 ENCOUNTER — Telehealth (INDEPENDENT_AMBULATORY_CARE_PROVIDER_SITE_OTHER): Payer: Self-pay | Admitting: *Deleted

## 2013-11-10 NOTE — Telephone Encounter (Signed)
Home health nurse from [facility], [place] called into the office.  This is her 1st visit for this pt home health care.  The discharge summary states for pt to take her medications, but the pt advised the nurse that she was told not to take anything except Calcium Citrate 1500 mg day, Hydrocodone, and Multi-vitamin until she goes back to see her PCP.  The discharge summary states different and the home health nurse needs clarification.  Please advise!  Anderson [place]

## 2013-11-11 NOTE — Telephone Encounter (Signed)
Reviewed with Dr. Hassell Done.  Called and spoke to [facility] Nurse) made aware that medications need to be taking as directed by her PCP.  [place] verbalized understanding.

## 2013-11-17 ENCOUNTER — Encounter (INDEPENDENT_AMBULATORY_CARE_PROVIDER_SITE_OTHER): Payer: Self-pay | Admitting: General Surgery

## 2013-11-17 ENCOUNTER — Other Ambulatory Visit (INDEPENDENT_AMBULATORY_CARE_PROVIDER_SITE_OTHER): Payer: Self-pay | Admitting: General Surgery

## 2013-11-17 ENCOUNTER — Ambulatory Visit (INDEPENDENT_AMBULATORY_CARE_PROVIDER_SITE_OTHER): Payer: Medicare Other | Admitting: General Surgery

## 2013-11-17 ENCOUNTER — Ambulatory Visit (HOSPITAL_COMMUNITY)
Admission: RE | Admit: 2013-11-17 | Discharge: 2013-11-17 | Disposition: A | Discharge status: Home / Self Care | Payer: Medicare Other | Source: Ambulatory Visit | Attending: General Surgery | Admitting: General Surgery

## 2013-11-17 ENCOUNTER — Telehealth (INDEPENDENT_AMBULATORY_CARE_PROVIDER_SITE_OTHER): Payer: Self-pay

## 2013-11-17 VITALS — BP 124/78 | HR 82 | Temp 98.1°F | Ht 66.0 in | Wt 223.0 lb

## 2013-11-17 DIAGNOSIS — IMO0001 Reserved for inherently not codable concepts without codable children: Secondary | ICD-10-CM

## 2013-11-17 DIAGNOSIS — Z90411 Acquired partial absence of pancreas: Secondary | ICD-10-CM | POA: Insufficient documentation

## 2013-11-17 DIAGNOSIS — T814XXD Infection following a procedure, subsequent encounter: Principal | ICD-10-CM

## 2013-11-17 DIAGNOSIS — T814XXA Infection following a procedure, initial encounter: Principal | ICD-10-CM

## 2013-11-17 DIAGNOSIS — Y836 Removal of other organ (partial) (total) as the cause of abnormal reaction of the patient, or of later complication, without mention of misadventure at the time of the procedure: Secondary | ICD-10-CM | POA: Insufficient documentation

## 2013-11-17 DIAGNOSIS — IMO0002 Reserved for concepts with insufficient information to code with codable children: Secondary | ICD-10-CM | POA: Insufficient documentation

## 2013-11-17 DIAGNOSIS — Z9089 Acquired absence of other organs: Secondary | ICD-10-CM | POA: Insufficient documentation

## 2013-11-17 DIAGNOSIS — Z933 Colostomy status: Secondary | ICD-10-CM | POA: Insufficient documentation

## 2013-11-17 DIAGNOSIS — Z9049 Acquired absence of other specified parts of digestive tract: Secondary | ICD-10-CM | POA: Insufficient documentation

## 2013-11-17 DIAGNOSIS — R935 Abnormal findings on diagnostic imaging of other abdominal regions, including retroperitoneum: Secondary | ICD-10-CM | POA: Insufficient documentation

## 2013-11-17 DIAGNOSIS — K651 Peritoneal abscess: Principal | ICD-10-CM

## 2013-11-17 DIAGNOSIS — Z9884 Bariatric surgery status: Secondary | ICD-10-CM | POA: Insufficient documentation

## 2013-11-17 DIAGNOSIS — T8140XA Infection following a procedure, unspecified, initial encounter: Secondary | ICD-10-CM

## 2013-11-17 DIAGNOSIS — T8149XA Infection following a procedure, other surgical site, initial encounter: Secondary | ICD-10-CM | POA: Insufficient documentation

## 2013-11-17 LAB — POCT I-STAT, CHEM 8
BUN: 10 mg/dL (ref 6–23)
CHLORIDE: 101 meq/L (ref 96–112)
CREATININE: 0.7 mg/dL (ref 0.50–1.10)
Calcium, Ion: 1.23 mmol/L (ref 1.13–1.30)
Glucose, Bld: 92 mg/dL (ref 70–99)
HCT: 44 % (ref 36.0–46.0)
Hemoglobin: 15 g/dL (ref 12.0–15.0)
Potassium: 3.2 mEq/L — ABNORMAL LOW (ref 3.7–5.3)
SODIUM: 141 meq/L (ref 137–147)
TCO2: 22 mmol/L (ref 0–100)

## 2013-11-17 MED ORDER — IOHEXOL 300 MG/ML  SOLN
100.0000 mL | Freq: Once | INTRAMUSCULAR | Status: AC | PRN
  Administered 2013-11-17: 100 mL via INTRAVENOUS

## 2013-11-17 NOTE — Telephone Encounter (Signed)
Per Dr. Marlou Starks, verbal order given to change wound dressing wet/dry qd.

## 2013-11-17 NOTE — Progress Notes (Signed)
Subjective:     Patient ID: Carol Jackson, female   DOB: [birth year], 68 y.o.   MRN: [id number]  HPI The patient is a 68 year old white female who is a couple weeks status post colectomy with colostomy. When the nurse came out to change her vacuum dressing today she noted a large amount of cloudy drainage. The patient denies any fevers or chills. Her appetite has been good and her ostomy is working well.  Review of Systems     Objective:   Physical Exam On exam most of the wound has healed. She has 3 small openings that are draining a large amount of purulent material. There is no bile or stool drainage. The wounds were packed with gauze and she tolerated this well    Assessment:     The patient is a couple weeks status post colectomy with colostomy with what appears to be a wound infection.     Plan:     At this point I would plan to get a CT scan of her abdomen and pelvis to make sure she doesn't have a deeper underlying pocket That is causing the drainage persists.

## 2013-11-27 ENCOUNTER — Encounter (HOSPITAL_COMMUNITY): Payer: Self-pay | Admitting: Emergency Medicine

## 2013-11-27 ENCOUNTER — Inpatient Hospital Stay (HOSPITAL_COMMUNITY)
Admission: EM | Admit: 2013-11-27 | Discharge: 2013-12-03 | DRG: 858 | Disposition: A | Discharge status: Home Health | Payer: Medicare Other | Attending: Surgery | Admitting: Surgery

## 2013-11-27 DIAGNOSIS — Y838 Other surgical procedures as the cause of abnormal reaction of the patient, or of later complication, without mention of misadventure at the time of the procedure: Secondary | ICD-10-CM | POA: Diagnosis present

## 2013-11-27 DIAGNOSIS — I10 Essential (primary) hypertension: Secondary | ICD-10-CM | POA: Diagnosis present

## 2013-11-27 DIAGNOSIS — Z933 Colostomy status: Secondary | ICD-10-CM

## 2013-11-27 DIAGNOSIS — Z87891 Personal history of nicotine dependence: Secondary | ICD-10-CM

## 2013-11-27 DIAGNOSIS — Z8249 Family history of ischemic heart disease and other diseases of the circulatory system: Secondary | ICD-10-CM

## 2013-11-27 DIAGNOSIS — IMO0001 Reserved for inherently not codable concepts without codable children: Secondary | ICD-10-CM

## 2013-11-27 DIAGNOSIS — E785 Hyperlipidemia, unspecified: Secondary | ICD-10-CM | POA: Diagnosis present

## 2013-11-27 DIAGNOSIS — Z885 Allergy status to narcotic agent status: Secondary | ICD-10-CM

## 2013-11-27 DIAGNOSIS — K219 Gastro-esophageal reflux disease without esophagitis: Secondary | ICD-10-CM | POA: Diagnosis present

## 2013-11-27 DIAGNOSIS — M479 Spondylosis, unspecified: Secondary | ICD-10-CM | POA: Diagnosis present

## 2013-11-27 DIAGNOSIS — E119 Type 2 diabetes mellitus without complications: Secondary | ICD-10-CM | POA: Diagnosis present

## 2013-11-27 DIAGNOSIS — E039 Hypothyroidism, unspecified: Secondary | ICD-10-CM | POA: Diagnosis present

## 2013-11-27 DIAGNOSIS — T8140XA Infection following a procedure, unspecified, initial encounter: Principal | ICD-10-CM | POA: Diagnosis present

## 2013-11-27 DIAGNOSIS — Z881 Allergy status to other antibiotic agents status: Secondary | ICD-10-CM

## 2013-11-27 DIAGNOSIS — T814XXA Infection following a procedure, initial encounter: Secondary | ICD-10-CM

## 2013-11-27 DIAGNOSIS — Z6835 Body mass index (BMI) 35.0-35.9, adult: Secondary | ICD-10-CM | POA: Diagnosis not present

## 2013-11-27 DIAGNOSIS — Z79899 Other long term (current) drug therapy: Secondary | ICD-10-CM | POA: Diagnosis not present

## 2013-11-27 DIAGNOSIS — Z9884 Bariatric surgery status: Secondary | ICD-10-CM

## 2013-11-27 DIAGNOSIS — T8149XA Infection following a procedure, other surgical site, initial encounter: Secondary | ICD-10-CM

## 2013-11-27 LAB — COMPREHENSIVE METABOLIC PANEL
ALT: 12 U/L (ref 0–35)
ANION GAP: 16 — AB (ref 5–15)
AST: 16 U/L (ref 0–37)
Albumin: 2.7 g/dL — ABNORMAL LOW (ref 3.5–5.2)
Alkaline Phosphatase: 56 U/L (ref 39–117)
BUN: 9 mg/dL (ref 6–23)
CALCIUM: 9.5 mg/dL (ref 8.4–10.5)
CO2: 24 mEq/L (ref 19–32)
Chloride: 97 mEq/L (ref 96–112)
Creatinine, Ser: 0.72 mg/dL (ref 0.50–1.10)
GFR calc non Af Amer: 88 mL/min — ABNORMAL LOW (ref >90)
GLUCOSE: 103 mg/dL — AB (ref 70–99)
Potassium: 3.4 mEq/L — ABNORMAL LOW (ref 3.7–5.3)
Sodium: 137 mEq/L (ref 137–147)
TOTAL PROTEIN: 7.3 g/dL (ref 6.0–8.3)
Total Bilirubin: 0.4 mg/dL (ref 0.3–1.2)

## 2013-11-27 LAB — CBC WITH DIFFERENTIAL/PLATELET
Basophils Absolute: 0.1 10*3/uL (ref 0.0–0.1)
Basophils Relative: 0 % (ref 0–1)
EOS ABS: 0 10*3/uL (ref 0.0–0.7)
EOS PCT: 0 % (ref 0–5)
HEMATOCRIT: 39.5 % (ref 36.0–46.0)
HEMOGLOBIN: 13.5 g/dL (ref 12.0–15.0)
LYMPHS ABS: 2.7 10*3/uL (ref 0.7–4.0)
LYMPHS PCT: 15 % (ref 12–46)
MCH: 28.7 pg (ref 26.0–34.0)
MCHC: 34.2 g/dL (ref 30.0–36.0)
MCV: 84 fL (ref 78.0–100.0)
MONO ABS: 2.8 10*3/uL — AB (ref 0.1–1.0)
MONOS PCT: 16 % — AB (ref 3–12)
Neutro Abs: 12.5 10*3/uL — ABNORMAL HIGH (ref 1.7–7.7)
Neutrophils Relative %: 69 % (ref 43–77)
Platelets: 448 10*3/uL — ABNORMAL HIGH (ref 150–400)
RBC: 4.7 MIL/uL (ref 3.87–5.11)
RDW: 15.8 % — ABNORMAL HIGH (ref 11.5–15.5)
WBC: 18.1 10*3/uL — AB (ref 4.0–10.5)

## 2013-11-27 LAB — I-STAT CG4 LACTIC ACID, ED: Lactic Acid, Venous: 0.94 mmol/L (ref 0.5–2.2)

## 2013-11-27 MED ORDER — ACETAMINOPHEN 650 MG RE SUPP: 650.0000 mg | Freq: Four times a day (QID) | RECTAL | Status: DC | PRN

## 2013-11-27 MED ORDER — VITAMIN D3 25 MCG (1000 UNIT) PO TABS
1000.0000 [IU] | ORAL_TABLET | Freq: Every day | ORAL | Status: DC
  Administered 2013-11-27 – 2013-12-03 (×6): 1000 [IU] via ORAL
  Filled 2013-11-27 (×7): qty 1

## 2013-11-27 MED ORDER — LEVOTHYROXINE SODIUM 50 MCG PO TABS
50.0000 ug | ORAL_TABLET | Freq: Every day | ORAL | Status: DC
  Administered 2013-11-29 – 2013-12-03 (×5): 50 ug via ORAL
  Filled 2013-11-27 (×8): qty 1

## 2013-11-27 MED ORDER — SODIUM CHLORIDE 0.9 % IV SOLN
3.0000 g | Freq: Four times a day (QID) | INTRAVENOUS | Status: DC
  Administered 2013-11-27 – 2013-12-03 (×22): 3 g via INTRAVENOUS
  Filled 2013-11-27 (×25): qty 3

## 2013-11-27 MED ORDER — ACETAMINOPHEN 325 MG PO TABS: 650.0000 mg | ORAL_TABLET | Freq: Four times a day (QID) | ORAL | Status: DC | PRN

## 2013-11-27 MED ORDER — VITAMIN D-3 25 MCG (1000 UT) PO CAPS: 1000.0000 [IU] | ORAL_CAPSULE | Freq: Every day | ORAL | Status: DC

## 2013-11-27 MED ORDER — ONDANSETRON HCL 4 MG/2ML IJ SOLN
4.0000 mg | Freq: Four times a day (QID) | INTRAMUSCULAR | Status: DC | PRN
  Administered 2013-11-28: 4 mg via INTRAVENOUS
  Filled 2013-11-27: qty 2

## 2013-11-27 MED ORDER — ENOXAPARIN SODIUM 40 MG/0.4ML ~~LOC~~ SOLN
40.0000 mg | SUBCUTANEOUS | Status: DC
Start: 2013-11-27 — End: 2013-12-03
  Administered 2013-11-27 – 2013-12-02 (×6): 40 mg via SUBCUTANEOUS
  Filled 2013-11-27 (×7): qty 0.4

## 2013-11-27 MED ORDER — POTASSIUM CHLORIDE IN NACL 20-0.9 MEQ/L-% IV SOLN
INTRAVENOUS | Status: DC
  Administered 2013-11-27 – 2013-11-30 (×4): via INTRAVENOUS
  Filled 2013-11-27 (×10): qty 1000

## 2013-11-27 NOTE — H&P (Signed)
Carol Jackson is an 68 y.o. female.    Chief Complaint: wound drainage  HPI: patient is a 68 year old female approximately 6 weeks following laparoscopic Roux-en-Y gastric bypass. She had a previous distal pancreatectomy and splenectomy. Her bypass procedure was complicated by injury to the transverse colon requiring partial colectomy and colostomy several days later. Her wound was initially left open and treated with a VAC dressing and then subsequently wound packing. The skin has progressively closed but for a couple of weeks she has had persistent and worsening foul-smelling drainage from her wound. A CT scan was obtained on July 1 because of this which showed several pockets of fluid above the fascia deep her wound but no intra-abdominal infection or abscess. Home health has been packed out of 4 gauze through several small skin openings remaining in her wound but she has had increasing very copious and foul-smelling drainage from the wound several times per day. No fever or chills or significant abdominal pain. She has not had much appetite but is eating okay and colostomy is functioning without difficulty.  Past Medical History  Diagnosis Date  . Diabetes mellitus without complication   . GERD (gastroesophageal reflux disease)   . Hyperlipidemia   . Hypertension   . Thyroid disease     hypothyroidism  . Morbid obesity   . Pneumonia     hxof 2013 had booster for pneumonia 09/2013   . Hypothyroidism   . Arthritis     back     Past Surgical History  Procedure Laterality Date  . Laparoscopic gastric banding  04/03/04  . Abdominal hysterectomy  01/15/00  . Bladder suspension  11/08/08  . 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 Pancreas surgery  09/24/11    tail end removed  . Lung surgery  05/06/12  . Back surgery  1982 adn 1985     discs removed lumbar area   . Colonoscopy  2014   . Gastric roux-en-y N/A 10/19/2013    Procedure: LAPAROSCOPIC ROUX-EN-Y GASTRIC BYPASS WITH UPPER ENDOSCOPY AND  REMOVAL OF LAP BAND;  Surgeon: Pedro Earls, MD;  Location: [facility];  Service: General;  Laterality: N/A;  . Laparoscopy N/A 10/24/2013    Procedure: LAPAROSCOPY DIAGNOSTIC, EXPLORATORY LAPAROTOMY, LEFT TRANSVERSE COLON RESECTION WITH COLOSTOMY, UPPER ENDOSCOPY;  Surgeon: Shann Medal, MD;  Location: [facility];  Service: General;  Laterality: N/A;  . Central venous catheter insertion Right 10/24/2013    Procedure: INSERTION CENTRAL LINE ADULT;  Surgeon: Shann Medal, MD;  Location: [facility];  Service: General;  Laterality: Right;    Family History  Problem Relation Age of Onset  . Heart disease Father    Social History:  reports that she quit smoking about 30 years ago. Her smoking use included Cigarettes. She smoked 0.00 packs per day. She has never used smokeless tobacco. She reports that she drinks alcohol. She reports that she does not use illicit drugs.  Allergies:  Allergies  Allergen Reactions  . Morphine And Related Hives  . Levaquin [Levofloxacin] Hives, Swelling and Rash   No current facility-administered medications for this encounter.   Current Outpatient Prescriptions  Medication Sig Dispense Refill  . Cholecalciferol (VITAMIN D-3) 1000 UNITS CAPS Take 1,000 Units by mouth daily.       Marland Kitchen levothyroxine (SYNTHROID, LEVOTHROID) 50 MCG tablet Take 50 mcg by mouth daily before breakfast.         Results for orders placed during the hospital encounter of 11/27/13 (from the past 48 hour(s))  CBC WITH DIFFERENTIAL     Status: Abnormal   Collection Time    11/27/13  3:32 PM      Result Value Ref Range   WBC 18.1 (*) 4.0 - 10.5 K/uL   RBC 4.70  3.87 - 5.11 MIL/uL   Hemoglobin 13.5  12.0 - 15.0 g/dL   HCT 39.5  36.0 - 46.0 %   MCV 84.0  78.0 - 100.0 fL   MCH 28.7  26.0 - 34.0 pg   MCHC 34.2  30.0 - 36.0 g/dL   RDW 15.8 (*) 11.5 - 15.5 %   Platelets 448 (*) 150 - 400 K/uL   Neutrophils Relative % 69  43 - 77 %   Neutro Abs 12.5 (*) 1.7 - 7.7 K/uL   Lymphocytes Relative 15   12 - 46 %   Lymphs Abs 2.7  0.7 - 4.0 K/uL   Monocytes Relative 16 (*) 3 - 12 %   Monocytes Absolute 2.8 (*) 0.1 - 1.0 K/uL   Eosinophils Relative 0  0 - 5 %   Eosinophils Absolute 0.0  0.0 - 0.7 K/uL   Basophils Relative 0  0 - 1 %   Basophils Absolute 0.1  0.0 - 0.1 K/uL  COMPREHENSIVE METABOLIC PANEL     Status: Abnormal   Collection Time    11/27/13  3:32 PM      Result Value Ref Range   Sodium 137  137 - 147 mEq/L   Potassium 3.4 (*) 3.7 - 5.3 mEq/L   Chloride 97  96 - 112 mEq/L   CO2 24  19 - 32 mEq/L   Glucose, Bld 103 (*) 70 - 99 mg/dL   BUN 9  6 - 23 mg/dL   Creatinine, Ser 0.72  0.50 - 1.10 mg/dL   Calcium 9.5  8.4 - 10.5 mg/dL   Total Protein 7.3  6.0 - 8.3 g/dL   Albumin 2.7 (*) 3.5 - 5.2 g/dL   AST 16  0 - 37 U/L   ALT 12  0 - 35 U/L   Alkaline Phosphatase 56  39 - 117 U/L   Total Bilirubin 0.4  0.3 - 1.2 mg/dL   GFR calc non Af Amer 88 (*) >90 mL/min   GFR calc Af Amer >90  >90 mL/min   Comment: (NOTE)     The eGFR has been calculated using the CKD EPI equation.     This calculation has not been validated in all clinical situations.     eGFR's persistently <90 mL/min signify possible Chronic Kidney     Disease.   Anion gap 16 (*) 5 - 15  I-STAT CG4 LACTIC ACID, ED     Status: None   Collection Time    11/27/13  4:14 PM      Result Value Ref Range   Lactic Acid, Venous 0.94  0.5 - 2.2 mmol/L   No results found.  Review of Systems  Constitutional: Negative for fever and chills.  Respiratory: Negative for shortness of breath and wheezing.   Cardiovascular: Negative for chest pain and leg swelling.  Gastrointestinal: Negative for nausea, vomiting and abdominal pain.  Musculoskeletal: Positive for joint pain.    Blood pressure 161/88, pulse 80, temperature 98.8 F (37.1 C), temperature source Oral, resp. rate 18, SpO2 100.00%. Physical Exam  General: Alert, obese Caucasian female, in no distress Skin: Warm and dry. HEENT: No palpable masses or  thyromegaly. Sclera nonicteric. Pupils equal round and reactive. Lungs: Breath sounds clear and  equal without increased work of breathing Cardiovascular: Regular rate and rhythm without murmur. No JVD or edema. Peripheral pulses intact. Abdomen: Nondistended. Soft and nontender. Colostomy device and right abdomen intact. There is an upper midline wound with several almost pinpoint openings packed with either form gauze. As I removed the iodoform  gauze there is copious, probably more than 100 cc of tan very foul-smelling purulent drainage. Does not appear feculent. Extremities: No edema or joint swelling or deformity. No chronic venous stasis changes. Neurologic: Alert and fully oriented. Normal affect  Assessment/Plan Wound drainage. CT scan indicates undrained pockets of fluid in her midline wound without intra-abdominal source. I think her wound has closed superficially with underlying likely some necrotic tissue and infection which is inadequately drained and resulting in copious foul-smelling drainage. I discussed alternatives with the patient including antibiotic as an outpatient with continued wound care and close followup in the office with Dr. Hassell Done versus proceeding with incision drainage and debridement of her wound. She is not making any progress with the wound and I believe it needs to be opened and debrided and she feels that something needs to be done and therefore we will admit her for this procedure.  Dayzha Pogosyan T 11/27/2013, 4:41 PM

## 2013-11-27 NOTE — ED Provider Notes (Signed)
CSN: [id number]     Arrival date & time 11/27/13  1338 History   First MD Initiated Contact with Patient 11/27/13 1525     Chief Complaint  Patient presents with  . Wound Check     (Consider location/radiation/quality/duration/timing/severity/associated sxs/prior Treatment) The history is provided by the patient.  Carol Jackson is a 68 y.o. female hx of DM, GERD, HL, obesity here with wound check. She had a lap band surgery last month that was complicated and had to get rou-en-y and colostomy. Was discharged several days ago. Has home health nurse come in to change dressing daily. Today, as per the nurse, there is brownish drainage from the wound. Denies fevers. Also has bilateral hip pain but is in bed a lot. Denies history of sacral decub ulcers.    Past Medical History  Diagnosis Date  . Diabetes mellitus without complication   . GERD (gastroesophageal reflux disease)   . Hyperlipidemia   . Hypertension   . Thyroid disease     hypothyroidism  . Morbid obesity   . Pneumonia     hxof 2013 had booster for pneumonia 09/2013   . Hypothyroidism   . Arthritis     back    Past Surgical History  Procedure Laterality Date  . Laparoscopic gastric banding  04/03/04  . Abdominal hysterectomy  01/15/00  . Bladder suspension  11/08/08  . 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 Pancreas surgery  09/24/11    tail end removed  . Lung surgery  05/06/12  . Back surgery  1982 adn 1985     discs removed lumbar area   . Colonoscopy  2014   . 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 Laparoscopy N/A 10/24/2013    Procedure: LAPAROSCOPY DIAGNOSTIC, EXPLORATORY LAPAROTOMY, LEFT TRANSVERSE COLON RESECTION WITH COLOSTOMY, UPPER ENDOSCOPY;  Surgeon: Shann Medal, MD;  Location: [facility];  Service: General;  Laterality: N/A;  . Central  venous catheter insertion Right 10/24/2013    Procedure: INSERTION CENTRAL LINE ADULT;  Surgeon: Shann Medal, MD;  Location: [facility];  Service: General;  Laterality: Right;   Family History  Problem Relation Age of Onset  . Heart disease Father    History  Substance Use Topics  . Smoking status: Former Smoker    Types: Cigarettes    Quit date: 12/04/1983  . Smokeless tobacco: Never Used  . Alcohol Use: Yes     Comment: Occasional   OB History   Grav Para Term Preterm Abortions TAB SAB Ect Mult Living                 Review of Systems  Skin: Positive for wound.  All other systems reviewed and are negative.     Allergies  Morphine and related and Levaquin  Home Medications   Prior to Admission medications   Medication Sig Start Date End Date Taking? Authorizing Provider  Cholecalciferol (VITAMIN D-3) 1000 UNITS CAPS Take 1,000 Units by mouth daily.    Yes Historical Provider, MD  levothyroxine (SYNTHROID, LEVOTHROID) 50 MCG tablet Take 50 mcg by mouth daily before breakfast.   Yes Historical Provider, MD   BP 152/72  Pulse 72  Temp(Src) 98.6 F (37 C) (Oral)  Resp 18  SpO2 100% Physical Exam  Nursing note and vitals reviewed. Constitutional: She is oriented  to person, place, and time.  Slightly uncomfortable, obese   HENT:  Head: Normocephalic.  Mouth/Throat: Oropharynx is clear and moist.  Eyes: Conjunctivae are normal. Pupils are equal, round, and reactive to light.  Neck: Normal range of motion.  Cardiovascular: Normal rate, regular rhythm and normal heart sounds.   Pulmonary/Chest: Effort normal and breath sounds normal. No respiratory distress. She has no wheezes.  Abdominal:  Firm. Mildly tender (unchanged). Colostomy bag in place with brown stool. Abdominal dressing in place   Musculoskeletal: Normal range of motion. She exhibits no edema and no tenderness.  Neurological: She is alert and oriented to person, place, and time. No cranial nerve deficit.  Coordination normal.  Skin: Skin is warm and dry.  No obvious sacral decub ulcers   Psychiatric: She has a normal mood and affect. Her behavior is normal. Judgment and thought content normal.    ED Course  Procedures (including critical care time) Labs Review Labs Reviewed  CBC WITH DIFFERENTIAL - Abnormal; Notable for the following:    WBC 18.1 (*)    RDW 15.8 (*)    Platelets 448 (*)    Neutro Abs 12.5 (*)    Monocytes Relative 16 (*)    Monocytes Absolute 2.8 (*)    All other components within normal limits  COMPREHENSIVE METABOLIC PANEL - Abnormal; Notable for the following:    Potassium 3.4 (*)    Glucose, Bld 103 (*)    Albumin 2.7 (*)    GFR calc non Af Amer 88 (*)    Anion gap 16 (*)    All other components within normal limits  I-STAT CG4 LACTIC ACID, ED    Imaging Review No results found.   EKG Interpretation None      MDM   Final diagnoses:  None   Raiven Belizaire is a 68 y.o. female here with wound check. Surgery consulted and will see patient in the ED.   5:02 PM WBC 18. Surgery saw patient. Had abdominal abscess on previous CT. Will admit for I and D.      Wandra Arthurs, MD 11/27/13 [phone]

## 2013-11-27 NOTE — ED Notes (Signed)
Pt has wound on rt hip.  Pt's home health nurse came in and said that 'she didn't like the way it looked'.  So she told them to come to the ER and called Dr. Excell Seltzer to meet her here.

## 2013-11-28 ENCOUNTER — Encounter (HOSPITAL_COMMUNITY)
Admission: EM | Disposition: A | Discharge status: Home Health | Payer: Self-pay | Source: Home / Self Care | Attending: Surgery

## 2013-11-28 ENCOUNTER — Encounter (HOSPITAL_COMMUNITY): Payer: Medicare Other | Admitting: Anesthesiology

## 2013-11-28 ENCOUNTER — Inpatient Hospital Stay (HOSPITAL_COMMUNITY): Payer: Medicare Other | Admitting: Anesthesiology

## 2013-11-28 ENCOUNTER — Inpatient Hospital Stay (HOSPITAL_COMMUNITY): Payer: Medicare Other

## 2013-11-28 ENCOUNTER — Encounter (HOSPITAL_COMMUNITY): Payer: Self-pay

## 2013-11-28 HISTORY — PX: INCISION AND DRAINAGE OF WOUND: SHX1803

## 2013-11-28 LAB — GLUCOSE, CAPILLARY: Glucose-Capillary: 94 mg/dL (ref 70–99)

## 2013-11-28 SURGERY — IRRIGATION AND DEBRIDEMENT WOUND
Anesthesia: General | Site: Abdomen

## 2013-11-28 MED ORDER — ROCURONIUM BROMIDE 100 MG/10ML IV SOLN
INTRAVENOUS | Status: AC
  Filled 2013-11-28: qty 1

## 2013-11-28 MED ORDER — PROPOFOL 10 MG/ML IV BOLUS
INTRAVENOUS | Status: DC | PRN
  Administered 2013-11-28: 200 mg via INTRAVENOUS

## 2013-11-28 MED ORDER — LIDOCAINE HCL (PF) 2 % IJ SOLN
INTRAMUSCULAR | Status: DC | PRN
  Administered 2013-11-28: 75 mg via INTRADERMAL

## 2013-11-28 MED ORDER — MIDAZOLAM HCL 2 MG/2ML IJ SOLN
INTRAMUSCULAR | Status: AC
  Filled 2013-11-28: qty 2

## 2013-11-28 MED ORDER — LACTATED RINGERS IV SOLN: INTRAVENOUS | Status: DC

## 2013-11-28 MED ORDER — LACTATED RINGERS IV SOLN
INTRAVENOUS | Status: DC | PRN
  Administered 2013-11-28: 07:00:00 via INTRAVENOUS

## 2013-11-28 MED ORDER — FENTANYL CITRATE 0.05 MG/ML IJ SOLN
INTRAMUSCULAR | Status: DC | PRN
  Administered 2013-11-28: 100 ug via INTRAVENOUS
  Administered 2013-11-28: 50 ug via INTRAVENOUS

## 2013-11-28 MED ORDER — 0.9 % SODIUM CHLORIDE (POUR BTL) OPTIME
TOPICAL | Status: DC | PRN
  Administered 2013-11-28: 1000 mL

## 2013-11-28 MED ORDER — PROMETHAZINE HCL 25 MG/ML IJ SOLN: 6.2500 mg | INTRAMUSCULAR | Status: DC | PRN

## 2013-11-28 MED ORDER — PROPOFOL 10 MG/ML IV BOLUS
INTRAVENOUS | Status: AC
  Filled 2013-11-28: qty 20

## 2013-11-28 MED ORDER — ONDANSETRON HCL 4 MG/2ML IJ SOLN
INTRAMUSCULAR | Status: AC
  Filled 2013-11-28: qty 2

## 2013-11-28 MED ORDER — MIDAZOLAM HCL 5 MG/5ML IJ SOLN
INTRAMUSCULAR | Status: DC | PRN
  Administered 2013-11-28: 2 mg via INTRAVENOUS

## 2013-11-28 MED ORDER — SODIUM CHLORIDE 0.9 % IJ SOLN
INTRAMUSCULAR | Status: AC
  Filled 2013-11-28: qty 10

## 2013-11-28 MED ORDER — FENTANYL CITRATE 0.05 MG/ML IJ SOLN
INTRAMUSCULAR | Status: AC
  Filled 2013-11-28: qty 5

## 2013-11-28 MED ORDER — OXYCODONE HCL 5 MG PO TABS
10.0000 mg | ORAL_TABLET | ORAL | Status: DC | PRN
  Administered 2013-11-28 – 2013-12-03 (×11): 10 mg via ORAL
  Filled 2013-11-28 (×11): qty 2

## 2013-11-28 MED ORDER — EPHEDRINE SULFATE 50 MG/ML IJ SOLN
INTRAMUSCULAR | Status: AC
  Filled 2013-11-28: qty 1

## 2013-11-28 MED ORDER — LIDOCAINE HCL (CARDIAC) 20 MG/ML IV SOLN
INTRAVENOUS | Status: AC
  Filled 2013-11-28: qty 5

## 2013-11-28 MED ORDER — FENTANYL CITRATE 0.05 MG/ML IJ SOLN: 25.0000 ug | INTRAMUSCULAR | Status: DC | PRN

## 2013-11-28 SURGICAL SUPPLY — 36 items
BANDAGE GAUZE ELAST BULKY 4 IN (GAUZE/BANDAGES/DRESSINGS) ×3 IMPLANT
BLADE HEX COATED 2.75 (ELECTRODE) ×3 IMPLANT
BLADE SURG 15 STRL LF DISP TIS (BLADE) ×1 IMPLANT
BLADE SURG 15 STRL SS (BLADE) ×2
BLADE SURG SZ10 CARB STEEL (BLADE) ×3 IMPLANT
CANISTER SUCTION 2500CC (MISCELLANEOUS) ×3 IMPLANT
CLOSURE WOUND 1/2 X4 (GAUZE/BANDAGES/DRESSINGS)
DECANTER SPIKE VIAL GLASS SM (MISCELLANEOUS) IMPLANT
DRAPE LAPAROSCOPIC ABDOMINAL (DRAPES) ×3 IMPLANT
ELECT REM PT RETURN 9FT ADLT (ELECTROSURGICAL) ×3
ELECTRODE REM PT RTRN 9FT ADLT (ELECTROSURGICAL) ×1 IMPLANT
GAUZE SPONGE 4X4 12PLY STRL (GAUZE/BANDAGES/DRESSINGS) ×2 IMPLANT
GAUZE SPONGE 4X4 16PLY XRAY LF (GAUZE/BANDAGES/DRESSINGS) ×3 IMPLANT
GLOVE BIOGEL PI IND STRL 7.0 (GLOVE) ×1 IMPLANT
GLOVE BIOGEL PI INDICATOR 7.0 (GLOVE) ×2
GOWN STRL REUS W/TWL LRG LVL3 (GOWN DISPOSABLE) ×3 IMPLANT
GOWN STRL REUS W/TWL XL LVL3 (GOWN DISPOSABLE) ×6 IMPLANT
KIT BASIN OR (CUSTOM PROCEDURE TRAY) ×3 IMPLANT
NEEDLE HYPO 22GX1.5 SAFETY (NEEDLE) ×3 IMPLANT
NEEDLE HYPO 25X1 1.5 SAFETY (NEEDLE) ×3 IMPLANT
NS IRRIG 1000ML POUR BTL (IV SOLUTION) ×3 IMPLANT
PACK BASIC VI WITH GOWN DISP (CUSTOM PROCEDURE TRAY) ×3 IMPLANT
PAD ABD 8X10 STRL (GAUZE/BANDAGES/DRESSINGS) ×3 IMPLANT
PENCIL BUTTON HOLSTER BLD 10FT (ELECTRODE) ×3 IMPLANT
SPONGE GAUZE 4X4 12PLY (GAUZE/BANDAGES/DRESSINGS) ×3 IMPLANT
STRIP CLOSURE SKIN 1/2X4 (GAUZE/BANDAGES/DRESSINGS) IMPLANT
SUT MNCRL AB 4-0 PS2 18 (SUTURE) ×3 IMPLANT
SUT VIC AB 3-0 54XBRD REEL (SUTURE) ×1 IMPLANT
SUT VIC AB 3-0 BRD 54 (SUTURE) ×3
SUT VIC AB 3-0 SH 27 (SUTURE) ×2
SUT VIC AB 3-0 SH 27XBRD (SUTURE) ×1 IMPLANT
SYR BULB IRRIGATION 50ML (SYRINGE) ×3 IMPLANT
SYR CONTROL 10ML LL (SYRINGE) ×3 IMPLANT
TAPE CLOTH SURG 4X10 WHT LF (GAUZE/BANDAGES/DRESSINGS) ×3 IMPLANT
TOWEL OR 17X26 10 PK STRL BLUE (TOWEL DISPOSABLE) ×3 IMPLANT
YANKAUER SUCT BULB TIP 10FT TU (MISCELLANEOUS) ×3 IMPLANT

## 2013-11-28 NOTE — Anesthesia Preprocedure Evaluation (Addendum)
Anesthesia Evaluation  Patient identified by MRN, date of birth, ID band Patient awake    Reviewed: Allergy & Precautions, H&P , NPO status , Patient's Chart, lab work & pertinent test results  Airway Mallampati: II TM Distance: >3 FB Neck ROM: Full    Dental no notable dental hx. (+) Teeth Intact, Dental Advisory Given   Pulmonary former smoker,  breath sounds clear to auscultation  Pulmonary exam normal       Cardiovascular hypertension, Rhythm:Regular Rate:Normal     Neuro/Psych negative neurological ROS  negative psych ROS   GI/Hepatic negative GI ROS, Neg liver ROS,   Endo/Other  diabetes, Type 2Hypothyroidism Morbid obesity  Renal/GU negative Renal ROS  negative genitourinary   Musculoskeletal negative musculoskeletal ROS (+)   Abdominal   Peds negative pediatric ROS (+)  Hematology negative hematology ROS (+)   Anesthesia Other Findings   Reproductive/Obstetrics negative OB ROS                          Anesthesia Physical Anesthesia Plan  ASA: II and emergent  Anesthesia Plan: General   Post-op Pain Management:    Induction: Intravenous  Airway Management Planned: LMA  Additional Equipment:   Intra-op Plan:   Post-operative Plan:   Informed Consent: I have reviewed the patients History and Physical, chart, labs and discussed the procedure including the risks, benefits and alternatives for the proposed anesthesia with the patient or authorized representative who has indicated his/her understanding and acceptance.   Dental advisory given  Plan Discussed with: CRNA  Anesthesia Plan Comments:         Anesthesia Quick Evaluation

## 2013-11-28 NOTE — Anesthesia Postprocedure Evaluation (Signed)
Anesthesia Post Note  Patient: Carol Jackson  Procedure(s) Performed: Procedure(s) (LRB): IRRIGATION AND DEBRIDEMENT ABDOMINAL INCISION (N/A)  Anesthesia type: General  Patient location: PACU  Post pain: Pain level controlled  Post assessment: Post-op Vital signs reviewed  Last Vitals:  Filed Vitals:   11/28/13 1115  BP: 157/84  Pulse: 64  Temp: 36.8 C  Resp: 16    Post vital signs: Reviewed  Level of consciousness: sedated  Complications: No apparent anesthesia complications

## 2013-11-28 NOTE — Transfer of Care (Signed)
Immediate Anesthesia Transfer of Care Note  Patient: Carol Jackson  Procedure(s) Performed: Procedure(s): IRRIGATION AND DEBRIDEMENT ABDOMINAL INCISION (N/A)  Patient Location: PACU  Anesthesia Type:General  Level of Consciousness: awake, sedated and patient cooperative  Airway & Oxygen Therapy: Patient Spontanous Breathing and Patient connected to face mask oxygen  Post-op Assessment: Report given to PACU RN and Post -op Vital signs reviewed and stable  Post vital signs: Reviewed and stable  Complications: No apparent anesthesia complications

## 2013-11-28 NOTE — Op Note (Signed)
Preoperative Diagnosis: Postoperative wound infection ABDOMINAL INCISION INFECTION  Postoprative Diagnosis: Postoperative wound infection ABDOMINAL INCISION INFECTION  Procedure: Procedure(s): IRRIGATION AND DEBRIDEMENT ABDOMINAL INCISION   Surgeon: Excell Seltzer T   Assistants: none  Anesthesia:  General endotracheal anesthesia  Indications: patient is a 68 year old female 5-6 weeks post emergency colectomy and colostomy following gastric bypass. Her midline wound was treated with a VAC dressing. Recently she has developed persistent and worsening high-volume purulent drainage through several sinus tracts along her upper midline wound at the rest of the skin being healed over. CT scan last week showed fluid collections along the fashion the deep subcutaneous tissue. The drainage has become increasingly voluminous and foul-smelling. I've recommended proceeding with incision drainage and debridement of her midline incision. The procedure was discussed in detail documented elsewhere and she agrees to proceed.    Procedure Detail:  Patient was brought to the operating room, placed in supine position on the operating table, and general endotracheal anesthesia induced.  Ostomy device was removed and the abdomen widely sterilely prepped and draped. She was on IV antibiotics. Patient timeout was performed procedure verified. I initially excised about one half of the upper scar and dissection was carried down into a large deep subcutaneous abscess pocket. A large amount of pus was drained. There was liquefied necrotic fascia and suture material at the base of the wound that was debrided. This pocket extended down to the inferior wound with a large amount of pus coming up from this area. The remainder of the wound was opened completely down to below the umbilicus. Further necrotic fascia particularly at the lower end of the wound which was partially liquefied and essentially already separated was  debrided from surrounding healthy tissue. A number free-floating sutures were removed. At this point all tissue appeared healthy. There was no tracking down into the abdominal cavity although I expect the base of the wound inferiorly was omentum. The wound was thoroughly irrigated with saline and packed with moist saline gauze. Sponge needle and sponge counts were correct. Patient was taken to recovery room in good condition.    Findings: As above  Estimated Blood Loss:  Minimal         Drains: wound packed open with Kerlix gauze  Blood Given: none          Specimens: culture and sensitivity        Complications:  * No complications entered in OR log *         Disposition: PACU - hemodynamically stable.         Condition: stable

## 2013-11-28 NOTE — Progress Notes (Signed)
History reviewed with pt upon arrival to unit on 11/27/13 at 1845.

## 2013-11-29 ENCOUNTER — Encounter (HOSPITAL_COMMUNITY): Payer: Self-pay | Admitting: General Surgery

## 2013-11-29 NOTE — Progress Notes (Signed)
Patient ID: Ginnette Gates, female   DOB: September 08, 1945, 68 y.o.   MRN: [id number] [facility] Progress Note:   1 Day Post-Op  Subjective: Mental status is clear Objective: Vital signs in last 24 hours: Temp:  [97.3 F (36.3 C)-98.2 F (36.8 C)] 97.7 F (36.5 C) (07/13 0651) Pulse Rate:  [58-74] 63 (07/13 0651) Resp:  [12-18] 18 (07/13 0651) BP: (144-157)/(58-84) 149/69 mmHg (07/13 0651) SpO2:  [94 %-100 %] 98 % (07/13 0651) Weight:  [218 lb (98.884 kg)-220 lb 1.6 oz (99.837 kg)] 220 lb 1.6 oz (99.837 kg) (07/12 2136)  Intake/Output from previous day: 07/12 0701 - 07/13 0700 In: 1760 [P.O.:60; I.V.:1600; IV Piggyback:100] Out: 1450 [Urine:1450] Intake/Output this shift:    Physical Exam: Work of breathing is normal  Lab Results:  Results for orders placed during the hospital encounter of 11/27/13 (from the past 48 hour(s))  CBC WITH DIFFERENTIAL     Status: Abnormal   Collection Time    11/27/13  3:32 PM      Result Value Ref Range   WBC 18.1 (*) 4.0 - 10.5 K/uL   RBC 4.70  3.87 - 5.11 MIL/uL   Hemoglobin 13.5  12.0 - 15.0 g/dL   HCT 39.5  36.0 - 46.0 %   MCV 84.0  78.0 - 100.0 fL   MCH 28.7  26.0 - 34.0 pg   MCHC 34.2  30.0 - 36.0 g/dL   RDW 15.8 (*) 11.5 - 15.5 %   Platelets 448 (*) 150 - 400 K/uL   Neutrophils Relative % 69  43 - 77 %   Neutro Abs 12.5 (*) 1.7 - 7.7 K/uL   Lymphocytes Relative 15  12 - 46 %   Lymphs Abs 2.7  0.7 - 4.0 K/uL   Monocytes Relative 16 (*) 3 - 12 %   Monocytes Absolute 2.8 (*) 0.1 - 1.0 K/uL   Eosinophils Relative 0  0 - 5 %   Eosinophils Absolute 0.0  0.0 - 0.7 K/uL   Basophils Relative 0  0 - 1 %   Basophils Absolute 0.1  0.0 - 0.1 K/uL  COMPREHENSIVE METABOLIC PANEL     Status: Abnormal   Collection Time    11/27/13  3:32 PM      Result Value Ref Range   Sodium 137  137 - 147 mEq/L   Potassium 3.4 (*) 3.7 - 5.3 mEq/L   Chloride 97  96 - 112 mEq/L   CO2 24  19 - 32 mEq/L   Glucose, Bld 103 (*) 70 - 99 mg/dL   BUN  9  6 - 23 mg/dL   Creatinine, Ser 0.72  0.50 - 1.10 mg/dL   Calcium 9.5  8.4 - 10.5 mg/dL   Total Protein 7.3  6.0 - 8.3 g/dL   Albumin 2.7 (*) 3.5 - 5.2 g/dL   AST 16  0 - 37 U/L   ALT 12  0 - 35 U/L   Alkaline Phosphatase 56  39 - 117 U/L   Total Bilirubin 0.4  0.3 - 1.2 mg/dL   GFR calc non Af Amer 88 (*) >90 mL/min   GFR calc Af Amer >90  >90 mL/min   Comment: (NOTE)     The eGFR has been calculated using the CKD EPI equation.     This calculation has not been validated in all clinical situations.     eGFR's persistently <90 mL/min signify possible Chronic Kidney     Disease.   Anion gap 16 (*)  5 - 15  I-STAT CG4 LACTIC ACID, ED     Status: None   Collection Time    11/27/13  4:14 PM      Result Value Ref Range   Lactic Acid, Venous 0.94  0.5 - 2.2 mmol/L  GLUCOSE, CAPILLARY     Status: None   Collection Time    11/28/13  8:25 AM      Result Value Ref Range   Glucose-Capillary 94  70 - 99 mg/dL    Radiology/Results: Dg Chest Port 1 View  11/28/2013   CLINICAL DATA:  Preop chest radiograph  EXAM: PORTABLE CHEST - 1 VIEW  COMPARISON:  11/02/2013  FINDINGS: Normal cardiac silhouette. There is a fine interstitial edema pattern which is new from prior. No focal infiltrate. No pleural fluid or pneumothorax.  IMPRESSION: Mild interstitial edema.   Electronically Signed   By: Suzy Bouchard M.D.   On: 11/28/2013 07:13    Anti-infectives: Anti-infectives   Start     Dose/Rate Route Frequency Ordered Stop   11/27/13 2000  Ampicillin-Sulbactam (UNASYN) 3 g in sodium chloride 0.9 % 100 mL IVPB     3 g 100 mL/hr over 60 Minutes Intravenous Every 6 hours 11/27/13 1839        Assessment/Plan: Problem List: Patient Active Problem List   Diagnosis Date Noted  . Postoperative wound infection 11/17/2013  . 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 Diabetes 09/29/2013  . Thoracotomy scar of right  chest-decortication at [facility] 09/29/2013  . Post-pancreatectomy diabetes-splenectomy at [facility] 09/29/2013  . 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 Obesity, unspecified 10/22/2012    Wound debridement discussed with Dr. Excell Seltzer.  Will begin wet-dry dressing changes.  Continue Unasyn 1 Day Post-Op    LOS: 2 days   Matt B. Hassell Done, MD, [facility] [phone] beeper [phone]  11/29/2013 7:31 AM

## 2013-11-30 ENCOUNTER — Ambulatory Visit (INDEPENDENT_AMBULATORY_CARE_PROVIDER_SITE_OTHER): Payer: Medicare Other | Admitting: Surgery

## 2013-11-30 LAB — WOUND CULTURE

## 2013-11-30 NOTE — Care Management Note (Addendum)
    Page 1 of 2   12/03/2013     12:49:21 PM CARE MANAGEMENT NOTE 12/03/2013  Patient:  Carol Jackson,Carol Jackson   Account Number:  [id number]  Date Initiated:  11/30/2013  Documentation initiated by:  Sunday Spillers  Subjective/Objective Assessment:   68 yo female admitted with post op wound infection. PTA lived at home with spouse.     Action/Plan:   Home when stable   Anticipated DC Date:  12/03/2013   Anticipated DC Plan:  [place]  CM consult      [facility] Choice  HOME HEALTH  Resumption Of Svcs/PTA Provider   Choice offered to / List presented to:          [facility] arranged  HH-1 RN  [place].   Status of service:  In process, will continue to follow Medicare Important Message given?  YES (If response is "NO", the following Medicare IM given date fields will be blank) Date Medicare IM given:  11/30/2013 Medicare IM given by:  [facility] Date Additional Medicare IM given:  12/03/2013 Additional Medicare IM given by:  Allene Dillon  Discharge Disposition:  [place]  Per UR Regulation:  Reviewed for med. necessity/level of care/duration of stay  If discussed at [place] of Stay Meetings, dates discussed:   12/02/2013    Comments:  12/02/13 Allene Dillon RN BSN [phone] Pt had wound vac placed today. She already has a [employer] vac at home. Husband will bring it in tomorrow to be hooked up before pt discharges home. [facility] will change it on Saturday and she will then start M-W-F schedule next week. [facility] has been updated.

## 2013-11-30 NOTE — Progress Notes (Signed)
Patient ID: Carol Jackson, female   DOB: Oct 26, 1945, 68 y.o.   MRN: [id number] [facility] Progress Note:   2 Days Post-Op  Subjective: Mental status is clear.  Eating well.  Metallic taste in mouth is gone when drinking water.  Objective: Vital signs in last 24 hours: Temp:  [97.7 F (36.5 C)-98.2 F (36.8 C)] 97.9 F (36.6 C) (07/14 0545) Pulse Rate:  [61-64] 64 (07/14 0545) Resp:  [18] 18 (07/14 0545) BP: (151-154)/(78-83) 154/78 mmHg (07/14 0545) SpO2:  [93 %-100 %] 96 % (07/14 0545)  Intake/Output from previous day: 07/13 0701 - 07/14 0700 In: 2160 [P.O.:1560; I.V.:600] Out: 1050 [Urine:1050] Intake/Output this shift: Total I/O In: -  Out: 250 [Urine:250]  Physical Exam: Work of breathing is normal.  Wound repacked twice yesterday.    Lab Results:  No results found for this or any previous visit (from the past 48 hour(s)).  Radiology/Results: No results found.  Anti-infectives: Anti-infectives   Start     Dose/Rate Route Frequency Ordered Stop   11/27/13 2000  Ampicillin-Sulbactam (UNASYN) 3 g in sodium chloride 0.9 % 100 mL IVPB     3 g 100 mL/hr over 60 Minutes Intravenous Every 6 hours 11/27/13 1839        Assessment/Plan: Problem List: Patient Active Problem List   Diagnosis Date Noted  . Postoperative wound infection 11/17/2013  . 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 Diabetes 09/29/2013  . Thoracotomy scar of right chest-decortication at [facility] 09/29/2013  . Post-pancreatectomy diabetes-splenectomy at [facility] 09/29/2013  . 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 Obesity, unspecified 10/22/2012    Hopeful discharge Thursday.   Will check labs in the AM.   2 Days Post-Op    LOS: 3 days   Matt B. Hassell Done, MD, [facility] [phone] beeper [phone]  11/30/2013 8:49 AM

## 2013-12-01 LAB — BASIC METABOLIC PANEL
Anion gap: 15 (ref 5–15)
BUN: 4 mg/dL — AB (ref 6–23)
CALCIUM: 8.6 mg/dL (ref 8.4–10.5)
CO2: 27 mEq/L (ref 19–32)
Chloride: 100 mEq/L (ref 96–112)
Creatinine, Ser: 0.65 mg/dL (ref 0.50–1.10)
GFR calc Af Amer: >90 mL/min (ref >90)
Glucose, Bld: 85 mg/dL (ref 70–99)
Potassium: 3.2 mEq/L — ABNORMAL LOW (ref 3.7–5.3)
SODIUM: 142 meq/L (ref 137–147)

## 2013-12-01 LAB — CBC WITH DIFFERENTIAL/PLATELET
Basophils Absolute: 0.1 10*3/uL (ref 0.0–0.1)
Basophils Relative: 0 % (ref 0–1)
EOS ABS: 0.5 10*3/uL (ref 0.0–0.7)
EOS PCT: 4 % (ref 0–5)
HCT: 35.6 % — ABNORMAL LOW (ref 36.0–46.0)
Hemoglobin: 11.8 g/dL — ABNORMAL LOW (ref 12.0–15.0)
LYMPHS ABS: 3.6 10*3/uL (ref 0.7–4.0)
Lymphocytes Relative: 27 % (ref 12–46)
MCH: 27.8 pg (ref 26.0–34.0)
MCHC: 33.1 g/dL (ref 30.0–36.0)
MCV: 83.8 fL (ref 78.0–100.0)
Monocytes Absolute: 1.6 10*3/uL — ABNORMAL HIGH (ref 0.1–1.0)
Monocytes Relative: 12 % (ref 3–12)
Neutro Abs: 7.5 10*3/uL (ref 1.7–7.7)
Neutrophils Relative %: 57 % (ref 43–77)
PLATELETS: 456 10*3/uL — AB (ref 150–400)
RBC: 4.25 MIL/uL (ref 3.87–5.11)
RDW: 16.3 % — ABNORMAL HIGH (ref 11.5–15.5)
WBC: 13.2 10*3/uL — ABNORMAL HIGH (ref 4.0–10.5)

## 2013-12-01 NOTE — Progress Notes (Signed)
Patient alert and oriented, pain is controlled. Patient is tolerating fluids, and has appreciated the addition of the ambassador assist  for meal ordering.  All questions answered, will continue to monitor.

## 2013-12-01 NOTE — Clinical Documentation Improvement (Signed)
Please clarify documentation in the medical record of "debridement.  Please document the below (4) key elements in a progress note:   Type of instrument used?   Depth of debridement/type of tissue removed? (e.g., skin, subcutaneous tissue, fascia, muscle, bone, other)   Did the debridement extend outside or beyond the wound margins?   Excisional vs. nonexcisional?   Irrigation of wound (e.g, Versajet)?   Location of wound?   Document the size of the debridement.  Clinical Indicator:  Per 11/28/13 Op. Note:   MD excised about one half of the upper scar and &dissection was carried down into a large deep subcutaneous abscess pocket. A large amount of pus was drained. There was liquefied necrotic fascia and suture material at the base of the wound that was debrided. This pocket extended down to the inferior wound with a large amount of pus coming up from this area. The remainder of the wound was opened completely down to below the umbilicus.   Thank You,  Serena Colonel ,RN Clinical Documentation Specialist:  [address] Information Management

## 2013-12-01 NOTE — Consult Note (Signed)
WOC wound consult note Reason for Consult:Assess for NPWT placement over abdominal wound Wound type:Surgical I have spoken at length with patient, assessed the midline abdominal wound and will place NPWT (as well as change ostomy pouching system) early tomorrow morning (12/02/13). [place] nursing team will follow, and will remain available to this patient, the nursing, surgical and medical teams.  Thanks, Maudie Flakes, MSN, RN, [place], [place], [place] [phone])

## 2013-12-01 NOTE — Progress Notes (Signed)
Patient ID: Carol Jackson, female   DOB: 1945-10-30, 68 y.o.   MRN: [id number] [facility] Progress Note:   3 Days Post-Op  Subjective: Mental status is clear. Objective: Vital signs in last 24 hours: Temp:  [98 F (36.7 C)-98.6 F (37 C)] 98 F (36.7 C) (07/15 0630) Pulse Rate:  [61-65] 65 (07/15 0630) Resp:  [16-18] 18 (07/15 0630) BP: (123-158)/(58-90) 123/90 mmHg (07/15 0630) SpO2:  [96 %-98 %] 96 % (07/15 0630)  Intake/Output from previous day: 07/14 0701 - 07/15 0700 In: 3096.7 [P.O.:1800; I.V.:1196.7; IV Piggyback:100] Out: 2700 [Urine:2400; Stool:300] Intake/Output this shift: Total I/O In: 404.2 [P.O.:240; I.V.:164.2] Out: 1000 [Urine:1000]  Physical Exam: Work of breathing is normal.  Wound is deep and pink and clean.  I think that it could be managed with wound VAC  Lab Results:  Results for orders placed during the hospital encounter of 11/27/13 (from the past 48 hour(s))  CBC WITH DIFFERENTIAL     Status: Abnormal   Collection Time    12/01/13  5:31 AM      Result Value Ref Range   WBC 13.2 (*) 4.0 - 10.5 K/uL   RBC 4.25  3.87 - 5.11 MIL/uL   Hemoglobin 11.8 (*) 12.0 - 15.0 g/dL   HCT 35.6 (*) 36.0 - 46.0 %   MCV 83.8  78.0 - 100.0 fL   MCH 27.8  26.0 - 34.0 pg   MCHC 33.1  30.0 - 36.0 g/dL   RDW 16.3 (*) 11.5 - 15.5 %   Platelets 456 (*) 150 - 400 K/uL   Neutrophils Relative % 57  43 - 77 %   Neutro Abs 7.5  1.7 - 7.7 K/uL   Lymphocytes Relative 27  12 - 46 %   Lymphs Abs 3.6  0.7 - 4.0 K/uL   Monocytes Relative 12  3 - 12 %   Monocytes Absolute 1.6 (*) 0.1 - 1.0 K/uL   Eosinophils Relative 4  0 - 5 %   Eosinophils Absolute 0.5  0.0 - 0.7 K/uL   Basophils Relative 0  0 - 1 %   Basophils Absolute 0.1  0.0 - 0.1 K/uL  BASIC METABOLIC PANEL     Status: Abnormal   Collection Time    12/01/13  5:31 AM      Result Value Ref Range   Sodium 142  137 - 147 mEq/L   Potassium 3.2 (*) 3.7 - 5.3 mEq/L   Chloride 100  96 - 112 mEq/L   CO2 27   19 - 32 mEq/L   Glucose, Bld 85  70 - 99 mg/dL   BUN 4 (*) 6 - 23 mg/dL   Creatinine, Ser 0.65  0.50 - 1.10 mg/dL   Calcium 8.6  8.4 - 10.5 mg/dL   GFR calc non Af Amer >90  >90 mL/min   GFR calc Af Amer >90  >90 mL/min   Comment: (NOTE)     The eGFR has been calculated using the CKD EPI equation.     This calculation has not been validated in all clinical situations.     eGFR's persistently <90 mL/min signify possible Chronic Kidney     Disease.   Anion gap 15  5 - 15    Radiology/Results: No results found.  Anti-infectives: Anti-infectives   Start     Dose/Rate Route Frequency Ordered Stop   11/27/13 2000  Ampicillin-Sulbactam (UNASYN) 3 g in sodium chloride 0.9 % 100 mL IVPB     3 g  100 mL/hr over 60 Minutes Intravenous Every 6 hours 11/27/13 1839        Assessment/Plan: Problem List: Patient Active Problem List   Diagnosis Date Noted  . Postoperative wound infection 11/17/2013  . 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 Diabetes 09/29/2013  . Thoracotomy scar of right chest-decortication at [facility] 09/29/2013  . Post-pancreatectomy diabetes-splenectomy at [facility] 09/29/2013  . 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 Obesity, unspecified 10/22/2012    Wound looks better.  Will ask for [facility] placement.  3 Days Post-Op    LOS: 4 days   Matt B. Hassell Done, MD, [facility] [phone] beeper [phone]  12/01/2013 11:29 AM

## 2013-12-02 NOTE — Progress Notes (Signed)
Patient ID: Carol Jackson, female   DOB: May 05, 1946, 67 y.o.   MRN: [id number] [place] [facility] Progress Note:   4 Days Post-Op  Subjective: Mental status is clear Objective: Vital signs in last 24 hours: Temp:  [98.1 F (36.7 C)-98.5 F (36.9 C)] 98.1 F (36.7 C) (07/16 0628) Pulse Rate:  [63-68] 63 (07/16 0628) Resp:  [18-20] 18 (07/16 0628) BP: (155-171)/(64-81) 171/81 mmHg (07/16 0628) SpO2:  [96 %-98 %] 98 % (07/16 0628)  Intake/Output from previous day: 07/15 0701 - 07/16 0700 In: 2924.2 [P.O.:1560; I.V.:1164.2; IV Piggyback:200] Out: 4225 [Urine:3875; Stool:350] Intake/Output this shift:    Physical Exam: Work of breathing is normal;  Wound VAC fitting today.  May be able to be discharged in the next day or so.    Lab Results:  Results for orders placed during the hospital encounter of 11/27/13 (from the past 48 hour(s))  CBC WITH DIFFERENTIAL     Status: Abnormal   Collection Time    12/01/13  5:31 AM      Result Value Ref Range   WBC 13.2 (*) 4.0 - 10.5 K/uL   RBC 4.25  3.87 - 5.11 MIL/uL   Hemoglobin 11.8 (*) 12.0 - 15.0 g/dL   HCT 35.6 (*) 36.0 - 46.0 %   MCV 83.8  78.0 - 100.0 fL   MCH 27.8  26.0 - 34.0 pg   MCHC 33.1  30.0 - 36.0 g/dL   RDW 16.3 (*) 11.5 - 15.5 %   Platelets 456 (*) 150 - 400 K/uL   Neutrophils Relative % 57  43 - 77 %   Neutro Abs 7.5  1.7 - 7.7 K/uL   Lymphocytes Relative 27  12 - 46 %   Lymphs Abs 3.6  0.7 - 4.0 K/uL   Monocytes Relative 12  3 - 12 %   Monocytes Absolute 1.6 (*) 0.1 - 1.0 K/uL   Eosinophils Relative 4  0 - 5 %   Eosinophils Absolute 0.5  0.0 - 0.7 K/uL   Basophils Relative 0  0 - 1 %   Basophils Absolute 0.1  0.0 - 0.1 K/uL  BASIC METABOLIC PANEL     Status: Abnormal   Collection Time    12/01/13  5:31 AM      Result Value Ref Range   Sodium 142  137 - 147 mEq/L   Potassium 3.2 (*) 3.7 - 5.3 mEq/L   Chloride 100  96 - 112 mEq/L   CO2 27  19 - 32 mEq/L   Glucose, Bld 85  70 - 99 mg/dL   BUN 4 (*) 6 -  23 mg/dL   Creatinine, Ser 0.65  0.50 - 1.10 mg/dL   Calcium 8.6  8.4 - 10.5 mg/dL   GFR calc non Af Amer >90  >90 mL/min   GFR calc Af Amer >90  >90 mL/min   Comment: (NOTE)     The eGFR has been calculated using the CKD EPI equation.     This calculation has not been validated in all clinical situations.     eGFR's persistently <90 mL/min signify possible Chronic Kidney     Disease.   Anion gap 15  5 - 15    Radiology/Results: No results found.  Anti-infectives: Anti-infectives   Start     Dose/Rate Route Frequency Ordered Stop   11/27/13 2000  Ampicillin-Sulbactam (UNASYN) 3 g in sodium chloride 0.9 % 100 mL IVPB     3 g 100 mL/hr over 60 Minutes Intravenous  Every 6 hours 11/27/13 1839        Assessment/Plan: Problem List: Patient Active Problem List   Diagnosis Date Noted  . Postoperative wound infection 11/17/2013  . 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 Diabetes 09/29/2013  . Thoracotomy scar of right chest-decortication at [facility] 09/29/2013  . Post-pancreatectomy diabetes-splenectomy at [facility] 09/29/2013  . 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 Obesity, unspecified 10/22/2012    Wound cleaning up.  For [facility] and consider discharge in next day or so.  4 Days Post-Op    LOS: 5 days   Matt B. Hassell Done, MD, [facility] [phone] beeper [phone]  12/02/2013 8:59 AM

## 2013-12-02 NOTE — Consult Note (Signed)
WOC wound consult note Reason for Consult:Placement of negative pressure wound therapy Wound type:surgical Pressure Ulcer POA:No Measurement:13.5 x 8.5 x     5.5cm Wound bed:Red, moist Drainage (amount, consistency, odor) minimal amount serous Periwound:intact, clear Dressing procedure/placement/frequency:NPWT (V.A.C.) with 5 pieces of black granufoam used today to fill defect.  Attached to 165mmHg continuous pressure and immediate seal achieved.  Patient tolerated procedure well.    WOC ostomy consult note Stoma type/location: RUQ Colostomy Stomal assessment/size: 1 inch oval, now only slightly budded (near flush with abdomen) with skin crease more pronounced. Peristomal assessment: intact, clear Treatment options for stomal/peristomal skin: skin barrier Output soft brown stool Ostomy pouching: 2pc., 2 and 1/4 inch pouching system with skin barrier ring  Patient is independent in ostomy care, wound care resource [facility]) is in place with equipment for home use at home. May be able to discharge to home tomorrow.  Plan is to walk, rest and eat provided meals today. Thank you for allowing me to assist with this patient. Maudie Flakes, MSN, RN, [place], [place], [place] [phone])

## 2013-12-03 LAB — ANAEROBIC CULTURE

## 2013-12-03 NOTE — Progress Notes (Signed)
General Surgery Note  LOS: 6 days  POD -  5 Days Post-Op  Assessment/Plan: 1. IRRIGATION AND DEBRIDEMENT ABDOMINAL INCISION - 11/28/2013 - B. Hoxworth  On Unasyn   Has wound VAC  Looks good.  No fever.  Eating.  Ready to go home.  Will follow up with Dr. Hassell Done in one to 2 weeks.   2.  Diagnostic laparoscopy, converted to exploratory laparotomy with left transverse colon resection, right transverse end colostomy, Hartmann's pouch, upper endoscopy - 10/24/2013 - D. Hiilei Gerst  For staple injury to left transverse colon  3.  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 Martin 4.  Post-pancreatectomy -splenectomy at [facility]  5.  Diabetes mellitus 6.  History of right chest thoracotomy and decortication at [facility] 7.  Morbid obesity 8.  DVT prophylaxis - Lovenox   Active Problems:   Postoperative wound infection   Subjective:  Doing well.  Ready to go home. Objective:   Filed Vitals:   12/03/13 0509  BP: 181/74  Pulse: 60  Temp: 97.5 F (36.4 C)  Resp: 18     Intake/Output from previous day:  07/16 0701 - 07/17 0700 In: 1750 [P.O.:600; I.V.:950; IV Piggyback:200] Out: 2200 [Urine:2150; Stool:50]  Intake/Output this shift:      Physical Exam:   General: WN WF who is alert.   HEENT: Normal. Pupils equal. .   Lungs: Clear.   Abdomen: Soft.   Wound: VAC looks good.  Ostomy in RUQ okay.   Lab Results:    Recent Labs  12/01/13 0531  WBC 13.2*  HGB 11.8*  HCT 35.6*  PLT 456*    BMET   Recent Labs  12/01/13 0531  NA 142  K 3.2*  CL 100  CO2 27  GLUCOSE 85  BUN 4*  CREATININE 0.65  CALCIUM 8.6    PT/INR  No results found for this basename: LABPROT, INR,  in the last 72 hours  ABG  No results found for this basename: PHART, PCO2, PO2, HCO3,  in the last 72 hours   Studies/Results:  No results found.   Anti-infectives:   Anti-infectives   Start     Dose/Rate Route Frequency Ordered Stop   11/27/13 2000  Ampicillin-Sulbactam  (UNASYN) 3 g in sodium chloride 0.9 % 100 mL IVPB     3 g 100 mL/hr over 60 Minutes Intravenous Every 6 hours 11/27/13 1839        Alphonsa Overall, MD, FACS Pager: [place] [facility] Office: [phone] 12/03/2013

## 2013-12-10 ENCOUNTER — Telehealth (INDEPENDENT_AMBULATORY_CARE_PROVIDER_SITE_OTHER): Payer: Self-pay

## 2013-12-10 NOTE — Telephone Encounter (Signed)
Wound vac order faxed to [facility] @ [phone].  Fax confirmation rec'd and attached to outgoing fax

## 2013-12-13 NOTE — Discharge Summary (Signed)
Physician Discharge Summary  Patient ID: Carol Jackson MRN: [id number] DOB/AGE: Apr 03, 1946 68 y.o.  Admit date: 11/27/2013 Discharge date: 12/03/2013  Admission Diagnoses:  Wound infection  Discharge Diagnoses:  Same post drainage and placement of wound VAC per Dr. Excell Seltzer  Active Problems:   Postoperative wound infection   Surgery:  Incision and drainage of abdominal infection with placement of wound VAC  Discharged Condition: improved  Hospital Course:   Had recurrent wound infection at home.  Was admitted by Dr. Excell Seltzer and open drainage and debridement were performed in the OR.  She did well after this and a wound VAC was applied by Ms. Lori McNichol.  She made progress and was ready for discharge by Dr. Lucia Gaskins on December 03, 2013  Consults: wound and ostomy consult  Significant Diagnostic Studies: none    Discharge Exam: Blood pressure 181/74, pulse 60, temperature 97.5 F (36.4 C), temperature source Oral, resp. rate 18, height 5\' 6"  (1.676 m), weight 220 lb 1.6 oz (99.837 kg), SpO2 94.00%. VAC in place in abdominal incision  Disposition: 06-Home-Health Care Svc  Discharge Instructions   Diet - low sodium heart healthy    Complete by:  As directed      Increase activity slowly    Complete by:  As directed             Medication List         levothyroxine 50 MCG tablet  Commonly known as:  SYNTHROID, LEVOTHROID  Take 50 mcg by mouth daily before breakfast.     Vitamin D-3 1000 UNITS Caps  Take 1,000 Units by mouth daily.         SignedPedro Earls 12/13/2013, 10:23 AM

## 2013-12-14 ENCOUNTER — Ambulatory Visit: Payer: Medicare Other | Admitting: Dietician

## 2013-12-15 ENCOUNTER — Encounter (INDEPENDENT_AMBULATORY_CARE_PROVIDER_SITE_OTHER): Payer: Self-pay | Admitting: Surgery

## 2013-12-15 ENCOUNTER — Ambulatory Visit (INDEPENDENT_AMBULATORY_CARE_PROVIDER_SITE_OTHER): Payer: Medicare Other | Admitting: Surgery

## 2013-12-15 VITALS — BP 130/78 | HR 76 | Temp 97.5°F | Ht 66.0 in | Wt 213.0 lb

## 2013-12-15 DIAGNOSIS — IMO0001 Reserved for inherently not codable concepts without codable children: Secondary | ICD-10-CM

## 2013-12-15 DIAGNOSIS — T8140XA Infection following a procedure, unspecified, initial encounter: Secondary | ICD-10-CM

## 2013-12-15 DIAGNOSIS — T814XXA Infection following a procedure, initial encounter: Principal | ICD-10-CM

## 2013-12-15 NOTE — Progress Notes (Signed)
Carol Jackson 68 y.o.  Body mass index is 34.4 kg/(m^2).  Patient Active Problem List   Diagnosis Date Noted  . Postoperative wound infection 11/17/2013  . 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 Diabetes 09/29/2013  . Thoracotomy scar of right chest-decortication at [facility] 09/29/2013  . Post-pancreatectomy diabetes-splenectomy at [facility] 09/29/2013  . 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 Obesity, unspecified 10/22/2012    Allergies  Allergen Reactions  . Morphine And Related Hives  . Levaquin [Levofloxacin] Hives, Swelling and Rash      Past Surgical History  Procedure Laterality Date  . Laparoscopic gastric banding  04/03/04  . Abdominal hysterectomy  01/15/00  . Bladder suspension  11/08/08  . 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 Pancreas surgery  09/24/11    tail end removed  . Lung surgery  05/06/12  . Back surgery  1982 adn 1985     discs removed lumbar area   . Colonoscopy  2014   . 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 Laparoscopy N/A 10/24/2013    Procedure: LAPAROSCOPY DIAGNOSTIC, EXPLORATORY LAPAROTOMY, LEFT TRANSVERSE COLON RESECTION WITH COLOSTOMY, UPPER ENDOSCOPY;  Surgeon: Shann Medal, MD;  Location: [facility]S;  Service: General;  Laterality: N/A;  . Central venous catheter insertion Right 10/24/2013    Procedure: INSERTION CENTRAL LINE ADULT;  Surgeon: Shann Medal, MD;  Location: [facility]S;  Service: General;  Laterality: Right;  . Incision and drainage of wound N/A 11/28/2013    Procedure: IRRIGATION AND DEBRIDEMENT ABDOMINAL INCISION;  Surgeon: Edward Jolly, MD;  Location: [facility]S;  Service: General;  Laterality: N/A;   Rubie Maid, MD No diagnosis found.  Wound VAC changed by me.  The wound is beefy red.   The lower portion has a 3 cm deep area.   Will continue VAC therapy.  Will reexamine in three weeks. I reviewed the sequence of the complications from the division of her colon during the first procedure to the wound complications/infection after the second operation.  Will consider takedown of colostomy open in October.  They plan to go to [facility] in [place].  Matt B. Hassell Done, MD, [facility] [phone] beeper [phone]  12/15/2013 3:41 PM

## 2013-12-15 NOTE — Patient Instructions (Signed)
Vacuum-Assisted Closure Therapy °Vacuum-assisted closure (VAC) therapy uses a device that removes fluid and germs from wounds to help them heal. It is used on wounds that cannot be closed with stitches. They often heal slowly. Vacuum-assisted therapy helps the wound stay clean and healthy while the open wound slowly grows back together. °Vacuum-assisted closure therapy uses a bandage (dressing) that is made of foam. It is put inside the wound. Then, a drape is placed over the wound. This drape sticks to your skin to keep air out, and to protect the wound. A tube is hooked up to a small pump and is attached to the drape. The pump sucks out the fluid and germs. Vacuum-assisted closure therapy can also help reduce the bad smell that comes from the wound. °HOW DOES IT WORK?  °The vacuum pump pulls fluid through the foam dressing. The dressing may wrinkle during this process. The fluid goes into the tube and away from the wound. The fluid then goes into a container. The fluid in the container must be replaced if it is full or at least once a week, even if the container is not full. The pulling from the pump helps to close the wound and bring better circulation to the wound area.  The foam dressing covers and protects the wound. It helps your wound heal faster.  °HOW DOES IT FEEL?  °· You might feel a little pulling when the pump is on. °· You might also feel a mild vibrating sensation.   °· You might feel some discomfort when the dressing is taken off. °CAN I MOVE AROUND WITH VACUUM-ASSISTED CLOSURE THERAPY? °Yes, it has a backup battery which is used when the machine is not plugged in, as long as the battery is working, you can move freely. °WHAT ARE SOME THINGS I MUST KNOW? °· Do not turn off the pump yourself, unless instructed to do so by your healthcare provider, such as for bathing. °· Do not take off the dressing yourself, unless instructed to do so by your caregiver. °· You can wash or shower with the dressing.  However, do not take the pump into the shower. Make sure the wound dressing is protected and covered with plastic. The wound area must stay dry. °· Do not turn off the pump for more than 2 hours. If the pump is off for more than 2 hours, your nurse must change your dressing. °· Check frequently that the machine is on, that the machine indicates the therapy is on, and that all clamps are open. °THE ALARM IS SOUNDING! WHAT SHOULD I DO?  °· Stay calm. °· Do not turn off the pump or do anything with the dressing. °· Call your clinic or caregiver right away if the alarm goes off and you cannot fix the problem. Some reasons the alarm might go off include: °¨ The fluid collection container is full. °¨ The battery is low. °¨ The dressing has a leak. °· Explain to your caregiver what is happening. Follow the instructions you receive. °WHEN SHOULD I CALL FOR HELP?  °· You have severe pain. °· You have difficulty breathing. °· You have bleeding that will not stop. °· Your wound smells bad. °· You have redness, swelling, or fluid leaking from your wound. °· Your alarm goes off and you do not know what to do. °· You have a fever. °· Your wound itches severely. °· Your dressing changes are often painful or bleeding often occurs. °· You have diarrhea. °· You have a sore throat. °·   You have a rash around the dressing or anywhere else on your body. °· You feel nauseous. °· You feel dizzy or weak. °· The VAC machine has been off for more than 2 hours. °HOW DO I GET READY TO GO HOME WITH A PUMP?  °A trained caregiver will talk to you and answer your questions about your vacuum-assisted closure therapy before you go home. He or she will explain what to expect. A caregiver will come to your home to apply the pump and care for your wound.  The at-home caregiver will be available for questions and will come back for the scheduled dressing changes, usually every 48-72 hours (or more often for severely infected wounds). Your at-home  caregiver will also come if you are having an unexpected problem. If you have questions or do not know what to do when you go home, talk to your healthcare provider. °Document Released: 04/18/2008 Document Revised: 01/06/2013 Document Reviewed: 04/19/2011 °Exit[employer]® Patient Information ©2015 [employer]. This information is not intended to replace advice given to you by your health care provider. Make sure you discuss any questions you have with your health care provider. ° °

## 2013-12-21 ENCOUNTER — Encounter: Payer: Medicare Other | Attending: Surgery | Admitting: Dietician

## 2013-12-21 VITALS — Ht 65.0 in | Wt 210.0 lb

## 2013-12-21 DIAGNOSIS — E669 Obesity, unspecified: Secondary | ICD-10-CM | POA: Insufficient documentation

## 2013-12-21 DIAGNOSIS — Z9884 Bariatric surgery status: Secondary | ICD-10-CM | POA: Insufficient documentation

## 2013-12-21 DIAGNOSIS — Z6834 Body mass index (BMI) 34.0-34.9, adult: Secondary | ICD-10-CM | POA: Insufficient documentation

## 2013-12-21 NOTE — Progress Notes (Signed)
   Follow-up visit:  8 Weeks Post-Operative RYGB Surgery  Medical Nutrition Therapy:  Appt start time: 1400 end time:  1430.  Primary concerns today: Post-operative Bariatric Surgery Nutrition Management. Returns with a 40 lbs weight loss. Was in the hospital for 3 weeks post op since she had a second surgery within a few days of her RYGB to repair colon and had an ostomy bag. Has felt good in the past week and a half. Before than was feeling very tired. Able to tolerate foods well and nothing is making her ill.  Surgery date: 10/19/2013 Surgery type: RYGB Start weight at [facility]: 241.5 lbs on 12/03/12  Weight today: 210.0 lbs Weight change: 40.5 lbs  TANITA  BODY COMP RESULTS  10/04/2013 12/21/13   BMI (kg/m^2) 41.7 34.9   Fat Mass (lbs) 135 106.0   Fat Free Mass (lbs) 115.5 104.0   Total Body Water (lbs) 84.5 76.0    Preferred Learning Style:   No preference indicated   Learning Readiness:   Ready  24-hr recall: B (AM): egg with ketchup or yogurt [employer] (6 g) with coffee 30 minutes later Snk (AM): string cheese (6 g)  L (PM): 3-4 oz tuna or 1 egg or cheese or 1/4 cup to 1/2 cup cottage cheese with tomato (6-28 g) Snk (PM): not usually   D (PM): 2- 3 oz meat with tomato with cheese and cottage cheese (14-21 g) Snk (PM): 6 oz glass of milk (10 g) 3-4 x week sugar free popsicle or fudgsicle or Premier Protein (2-3 x week) (30 g)  Fluid intake: decaf coffee, water, sugar free lemonade (51 oz + 6/8 oz decaf coffee) + sometimes protein shake  Estimated total protein intake: 32-85 g   Medications: synthroid Supplementation: not taking them but does have them  CBG monitoring: every other day fasting Average CBG per patient: 112-120 mg/dl Last patient reported A1c: unsure of last Hgb A1c  Using straws: No Drinking while eating: No  Hair loss: Yes Carbonated beverages: may have let a diet coke go flat N/V/D/C: No Dumping syndrome: No  Recent physical activity:  None,  walking in her home has trouble with the heat and colostomy   Progress Towards Goal(s):  In progress.  Handouts given during visit include:  Phase 3A High Protein   Phase 3B High Protein + Non Starchy Vegetable   Nutritional Diagnosis:  [place]-3.3 Overweight/obesity related to past poor dietary habits and physical inactivity as evidenced by patient w/ recent RYGB surgery following dietary guidelines for continued weight loss.     Intervention:  Nutrition education/diet advancement.  Teaching Method Utilized:  Visual Auditory Hands on  Barriers to learning/adherence to lifestyle change: recovering from secondary surgery to colon, unable to tolerate heat to exercise  Demonstrated degree of understanding via:  Teach Back   Monitoring/Evaluation:  Dietary intake, exercise, lap band fills, and body weight. Follow up in 1 months for 3 month post-op visit.

## 2013-12-21 NOTE — Patient Instructions (Signed)
Goals:  Follow Phase 3B: High Protein + Non-Starchy Vegetables  Eat 3-6 small meals/snacks, every 3-5 hrs  Increase lean protein foods to meet 60g goal  Increase fluid intake to 64oz +  Avoid drinking 15 minutes before, during and 30 minutes after eating  Aim for >30 min of physical activity daily as tolerated   Check to make sure yogurt is less than 12 grams of carbs  Consider having a Premier Shake each to help reach protein goal  Resume vitamins, calcium, and B12

## 2014-01-06 ENCOUNTER — Ambulatory Visit (INDEPENDENT_AMBULATORY_CARE_PROVIDER_SITE_OTHER): Payer: Medicare Other | Admitting: Surgery

## 2014-01-06 ENCOUNTER — Encounter (INDEPENDENT_AMBULATORY_CARE_PROVIDER_SITE_OTHER): Payer: Self-pay | Admitting: Surgery

## 2014-01-06 VITALS — BP 124/68 | HR 70 | Resp 16 | Ht 66.0 in | Wt 209.6 lb

## 2014-01-06 DIAGNOSIS — Z933 Colostomy status: Secondary | ICD-10-CM

## 2014-01-06 DIAGNOSIS — T814XXA Infection following a procedure, initial encounter: Secondary | ICD-10-CM

## 2014-01-06 DIAGNOSIS — T8140XA Infection following a procedure, unspecified, initial encounter: Secondary | ICD-10-CM

## 2014-01-06 DIAGNOSIS — IMO0001 Reserved for inherently not codable concepts without codable children: Secondary | ICD-10-CM

## 2014-01-06 NOTE — Progress Notes (Signed)
Carol Jackson 68 y.o.  Body mass index is 33.85 kg/(m^2).  Patient Active Problem List   Diagnosis Date Noted  . Postoperative wound infection 11/17/2013  . 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 Diabetes 09/29/2013  . Thoracotomy scar of right chest-decortication at [facility] 09/29/2013  . 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 Obesity, unspecified 10/22/2012    Allergies  Allergen Reactions  . Morphine And Related Hives  . Levaquin [Levofloxacin] Hives, Swelling and Rash      Past Surgical History  Procedure Laterality Date  . Laparoscopic gastric banding  04/03/04  . Abdominal hysterectomy  01/15/00  . Bladder suspension  11/08/08  . 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 Pancreas surgery  09/24/11    tail end removed  . Lung surgery  05/06/12  . Back surgery  1982 adn 1985     discs removed lumbar area   . Colonoscopy  2014   . 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 Laparoscopy N/A 10/24/2013    Procedure: LAPAROSCOPY DIAGNOSTIC, EXPLORATORY LAPAROTOMY, LEFT TRANSVERSE COLON RESECTION WITH COLOSTOMY, UPPER ENDOSCOPY;  Surgeon: Shann Medal, MD;  Location: [facility];  Service: General;  Laterality: N/A;  . Central venous catheter insertion Right 10/24/2013    Procedure: INSERTION CENTRAL LINE ADULT;  Surgeon: Shann Medal, MD;  Location: [facility];  Service: General;  Laterality: Right;  . Incision and drainage of wound N/A 11/28/2013    Procedure: IRRIGATION AND DEBRIDEMENT ABDOMINAL INCISION;  Surgeon: Edward Jolly, MD;  Location: [facility];  Service: General;  Laterality: N/A;   Rubie Maid, MD No diagnosis found.  Incision is granulating in very well.  Lower part  is about 1.5. Cm deep still.  Continue wound VAC.  Will get contrast enema from below to assess level of Hartman pouch.  Return mid September to discuss surgery date.   Matt B. Hassell Done, MD, [facility] [phone] beeper [phone]  01/06/2014 12:51 PM

## 2014-01-06 NOTE — Addendum Note (Signed)
Addended by: Illene Regulus on: 01/06/2014 01:28 PM   Modules accepted: Orders

## 2014-01-17 ENCOUNTER — Telehealth (INDEPENDENT_AMBULATORY_CARE_PROVIDER_SITE_OTHER): Payer: Self-pay

## 2014-01-17 NOTE — Telephone Encounter (Signed)
Deanna from [facility] called to let Dr Hassell Done know that pt's wounds are now superficial and they are unable to apply the wound vac any longer. Informed Deanna that I would send Dr Hassell Done a message making him aware.

## 2014-01-18 ENCOUNTER — Ambulatory Visit (HOSPITAL_COMMUNITY)
Admission: RE | Admit: 2014-01-18 | Discharge: 2014-01-18 | Disposition: A | Discharge status: Home / Self Care | Payer: Medicare Other | Source: Ambulatory Visit | Attending: Surgery | Admitting: Surgery

## 2014-01-18 DIAGNOSIS — Y849 Medical procedure, unspecified as the cause of abnormal reaction of the patient, or of later complication, without mention of misadventure at the time of the procedure: Secondary | ICD-10-CM | POA: Insufficient documentation

## 2014-01-18 DIAGNOSIS — T8140XA Infection following a procedure, unspecified, initial encounter: Secondary | ICD-10-CM | POA: Insufficient documentation

## 2014-01-18 DIAGNOSIS — Z933 Colostomy status: Secondary | ICD-10-CM | POA: Insufficient documentation

## 2014-01-18 DIAGNOSIS — IMO0001 Reserved for inherently not codable concepts without codable children: Secondary | ICD-10-CM

## 2014-01-18 DIAGNOSIS — T814XXA Infection following a procedure, initial encounter: Secondary | ICD-10-CM

## 2014-01-18 MED ORDER — IOHEXOL 300 MG/ML  SOLN
450.0000 mL | Freq: Once | INTRAMUSCULAR | Status: AC | PRN
  Administered 2014-01-18: 450 mL via ORAL

## 2014-01-25 ENCOUNTER — Encounter: Payer: Medicare Other | Attending: Surgery | Admitting: Dietician

## 2014-01-25 ENCOUNTER — Ambulatory Visit: Payer: Medicare Other | Admitting: Dietician

## 2014-01-25 VITALS — Ht 65.0 in | Wt 206.5 lb

## 2014-01-25 DIAGNOSIS — Z6834 Body mass index (BMI) 34.0-34.9, adult: Secondary | ICD-10-CM | POA: Diagnosis not present

## 2014-01-25 DIAGNOSIS — E669 Obesity, unspecified: Secondary | ICD-10-CM

## 2014-01-25 DIAGNOSIS — Z9884 Bariatric surgery status: Secondary | ICD-10-CM | POA: Diagnosis not present

## 2014-01-25 NOTE — Progress Notes (Signed)
   Follow-up visit:  12 Weeks Post-Operative RYGB Surgery  Medical Nutrition Therapy:  Appt start time: 1100 end time:  4332.  Primary concerns today: Post-operative Bariatric Surgery Nutrition Management. Returns with a 3.5 lbs weight loss. Feeling a lot better than last time. No longer has her wound vac so she can move more. Trying to set up appointment for next surgery at [facility] to reconnect colon.   Writing food down. Able to tolerate all foods. Having very few carbohydrates. Taking all supplements. Weight had been at a plateau and is starting to lose weight again.   Surgery date: 10/19/2013 Surgery type: RYGB Start weight at [facility]: 241.5 lbs on 12/03/12  Weight today: 206.5 lbs  Weight change: 3.5 lbs Total weight loss: 44 lbs  TANITA  BODY COMP RESULTS  10/04/2013 12/21/13 01/25/14   BMI (kg/m^2) 41.7 34.9 34.4   Fat Mass (lbs) 135 106.0 104.5   Fat Free Mass (lbs) 115.5 104.0 102.0   Total Body Water (lbs) 84.5 76.0 74.5    Preferred Learning Style:   No preference indicated   Learning Readiness:   Ready  24-hr recall: B (AM): Premier Protein (30 g) or 1 egg and bacon on Sunday Snk (AM): Baby bell or string cheese (5 g)  L (PM): chicken (11 g), yogurt, jerkey, or gluten free pizza (11-29 g) Snk (PM): Baby bell or string cheese (6 g)  D (PM): steak, shrimp, chicken, broccoli, chili, hamburger (11 g+)  Snk (PM): Baby bell or string cheese (6 g)   Fluid intake: 12-16 oz decaf coffee, water, 32 oz decaf tea, 16 oz water,  11 oz sometimes protein shake (71 oz) Estimated total protein intake: 57-88 g   Medications: synthroid Supplementation: taking   CBG monitoring: every other day fasting Average CBG per patient: 105 - 119 mg/dl Last patient reported A1c: unsure of last Hgb A1c  Using straws: No Drinking while eating: Getting better, sips  Hair loss: Yes Carbonated beverages: No N/V/D/C: No Dumping syndrome: No  Recent physical activity:  Walking about 3.8 miles total  per day (2 x day) 60 minutes total  Progress Towards Goal(s):  In progress.    Nutritional Diagnosis:  [place]-3.3 Overweight/obesity related to past poor dietary habits and physical inactivity as evidenced by patient w/ recent RYGB surgery following dietary guidelines for continued weight loss.     Intervention:  Nutrition education/diet advancement.  Teaching Method Utilized:  Visual Auditory Hands on  Barriers to learning/adherence to lifestyle change: recovering from secondary surgery to colon  Demonstrated degree of understanding via:  Teach Back   Monitoring/Evaluation:  Dietary intake, exercise, lap band fills, and body weight. Follow up in 3 months for 6 month post-op visit.

## 2014-01-25 NOTE — Patient Instructions (Signed)
Goals:  Follow Phase 3B: High Protein + Non-Starchy Vegetables  Eat 3-6 small meals/snacks, every 3-5 hrs  Increase lean protein foods to meet 60g goal  Increase fluid intake to 64oz +  Avoid drinking 15 minutes before, during and 30 minutes after eating  Continue daily exercise (around 60 minutes)

## 2014-02-15 NOTE — Progress Notes (Signed)
Surgery on 02/24/2014.  preop on 02/18/2014 at 0930am.  Need orders in EPIC.  Thank You.

## 2014-02-16 ENCOUNTER — Encounter (HOSPITAL_COMMUNITY): Payer: Self-pay | Admitting: Pharmacy Technician

## 2014-02-18 ENCOUNTER — Inpatient Hospital Stay (HOSPITAL_COMMUNITY): Admission: RE | Admit: 2014-02-18 | Payer: Medicare Other | Source: Ambulatory Visit

## 2014-02-25 NOTE — Progress Notes (Signed)
Please put orders in Epic surgery 03-09-14 [pre op 03-03-14 Thanks

## 2014-03-03 ENCOUNTER — Encounter (HOSPITAL_COMMUNITY)
Admission: RE | Admit: 2014-03-03 | Discharge: 2014-03-03 | Disposition: A | Discharge status: Home / Self Care | Payer: Medicare Other | Source: Ambulatory Visit | Attending: Surgery | Admitting: Surgery

## 2014-03-03 ENCOUNTER — Encounter (HOSPITAL_COMMUNITY): Payer: Self-pay

## 2014-03-03 ENCOUNTER — Ambulatory Visit (HOSPITAL_COMMUNITY)
Admission: RE | Admit: 2014-03-03 | Discharge: 2014-03-03 | Disposition: A | Discharge status: Home / Self Care | Payer: Medicare Other | Source: Ambulatory Visit | Attending: Anesthesiology | Admitting: Anesthesiology

## 2014-03-03 ENCOUNTER — Encounter (HOSPITAL_COMMUNITY): Payer: Self-pay | Admitting: Pharmacy Technician

## 2014-03-03 ENCOUNTER — Other Ambulatory Visit: Payer: Self-pay

## 2014-03-03 DIAGNOSIS — R918 Other nonspecific abnormal finding of lung field: Secondary | ICD-10-CM | POA: Diagnosis not present

## 2014-03-03 DIAGNOSIS — R9389 Abnormal findings on diagnostic imaging of other specified body structures: Secondary | ICD-10-CM

## 2014-03-03 DIAGNOSIS — Z01818 Encounter for other preprocedural examination: Secondary | ICD-10-CM | POA: Insufficient documentation

## 2014-03-03 LAB — CBC
HCT: 44.7 % (ref 36.0–46.0)
HEMOGLOBIN: 15.5 g/dL — AB (ref 12.0–15.0)
MCH: 28.5 pg (ref 26.0–34.0)
MCHC: 34.7 g/dL (ref 30.0–36.0)
MCV: 82.3 fL (ref 78.0–100.0)
Platelets: 395 10*3/uL (ref 150–400)
RBC: 5.43 MIL/uL — AB (ref 3.87–5.11)
RDW: 16.3 % — ABNORMAL HIGH (ref 11.5–15.5)
WBC: 9.6 10*3/uL (ref 4.0–10.5)

## 2014-03-03 LAB — BASIC METABOLIC PANEL
Anion gap: 11 (ref 5–15)
BUN: 22 mg/dL (ref 6–23)
CO2: 28 meq/L (ref 19–32)
Calcium: 9.5 mg/dL (ref 8.4–10.5)
Chloride: 101 mEq/L (ref 96–112)
Creatinine, Ser: 0.68 mg/dL (ref 0.50–1.10)
GFR calc Af Amer: >90 mL/min (ref >90)
GFR calc non Af Amer: 89 mL/min — ABNORMAL LOW (ref >90)
GLUCOSE: 96 mg/dL (ref 70–99)
POTASSIUM: 3.9 meq/L (ref 3.7–5.3)
Sodium: 140 mEq/L (ref 137–147)

## 2014-03-03 LAB — ABO/RH: ABO/RH(D): AB POS

## 2014-03-03 NOTE — Pre-Procedure Instructions (Signed)
CBC, BMET, T/S, CXR, EKG WERE DONE TODAY PREOP AT [facility].  AS PER ANESTHESIOLOGIST'S GUIDELINES.  PT'S PREVIOUS CXR REPORT 7/201 ABNORMAL.

## 2014-03-03 NOTE — Patient Instructions (Signed)
CALL DR. MARTIN'S NURSE AND ASK IF YOU NEED TO DO ANY BOWEL PREP / CLEAR LIQUIDS DAY BEFORE SURGERY.  YOUR SURGERY IS SCHEDULED AT [facility]  ON:  Wednesday  10/21  REPORT TO  SHORT STAY CENTER AT:  11:00 AM   DO NOT EAT OR DRINK ANYTHING AFTER MIDNIGHT THE NIGHT BEFORE YOUR SURGERY.  YOU MAY BRUSH YOUR TEETH, RINSE OUT YOUR MOUTH--BUT NO WATER, NO FOOD, NO CHEWING GUM, NO MINTS, NO CANDIES, NO CHEWING TOBACCO.  PLEASE TAKE THE FOLLOWING MEDICATIONS THE AM OF YOUR SURGERY WITH A FEW SIPS OF WATER:  SYNTHROID   DO NOT BRING VALUABLES, MONEY, CREDIT CARDS.  DO NOT WEAR JEWELRY, MAKE-UP, NAIL POLISH AND NO METAL PINS OR CLIPS IN YOUR HAIR. CONTACT LENS, DENTURES / PARTIALS, GLASSES SHOULD NOT BE WORN TO SURGERY AND IN MOST CASES-HEARING AIDS WILL NEED TO BE REMOVED.  BRING YOUR GLASSES CASE, ANY EQUIPMENT NEEDED FOR YOUR CONTACT LENS. FOR PATIENTS ADMITTED TO THE HOSPITAL--CHECK OUT TIME THE DAY OF DISCHARGE IS 11:00 AM.  ALL INPATIENT ROOMS ARE PRIVATE - WITH BATHROOM, TELEPHONE, TELEVISION AND WIFI INTERNET.    PLEASE BE AWARE THAT YOU MAY NEED ADDITIONAL BLOOD DRAWN DAY OF YOUR SURGERY  _______________________________________________________________________   [facility] - Preparing for Surgery Before surgery, you can play an important role.  Because skin is not sterile, your skin needs to be as free of germs as possible.  You can reduce the number of germs on your skin by washing with CHG (chlorahexidine gluconate) soap before surgery.  CHG is an antiseptic cleaner which kills germs and bonds with the skin to continue killing germs even after washing. Please DO NOT use if you have an allergy to CHG or antibacterial soaps.  If your skin becomes reddened/irritated stop using the CHG and inform your nurse when you arrive at Short Stay. Do not shave (including legs and underarms) for at least 48 hours prior to the first CHG shower.  You may shave your face/neck. Please follow  these instructions carefully:  1.  Shower with CHG Soap the night before surgery and the  morning of Surgery.  2.  If you choose to wash your hair, wash your hair first as usual with your  normal  shampoo.  3.  After you shampoo, rinse your hair and body thoroughly to remove the  shampoo.                           4.  Use CHG as you would any other liquid soap.  You can apply chg directly  to the skin and wash                       Gently with a scrungie or clean washcloth.  5.  Apply the CHG Soap to your body ONLY FROM THE NECK DOWN.   Do not use on face/ open                           Wound or open sores. Avoid contact with eyes, ears mouth and genitals (private parts).                       Wash face,  Genitals (private parts) with your normal soap.             6.  Wash thoroughly, paying special attention to the  area where your surgery  will be performed.  7.  Thoroughly rinse your body with warm water from the neck down.  8.  DO NOT shower/wash with your normal soap after using and rinsing off  the CHG Soap.                9.  Pat yourself dry with a clean towel.            10.  Wear clean pajamas.            11.  Place clean sheets on your bed the night of your first shower and do not  sleep with pets. Day of Surgery : Do not apply any lotions/deodorants the morning of surgery.  Please wear clean clothes to the hospital/surgery center.  FAILURE TO FOLLOW THESE INSTRUCTIONS MAY RESULT IN THE CANCELLATION OF YOUR SURGERY PATIENT SIGNATURE_________________________________  NURSE SIGNATURE__________________________________  ________________________________________________________________________  WHAT IS A BLOOD TRANSFUSION? Blood Transfusion Information  A transfusion is the replacement of blood or some of its parts. Blood is made up of multiple cells which provide different functions.  Red blood cells carry oxygen and are used for blood loss replacement.  White blood cells fight  against infection.  Platelets control bleeding.  Plasma helps clot blood.  Other blood products are available for specialized needs, such as hemophilia or other clotting disorders. BEFORE THE TRANSFUSION  Who gives blood for transfusions?   Healthy volunteers who are fully evaluated to make sure their blood is safe. This is blood bank blood. Transfusion therapy is the safest it has ever been in the practice of medicine. Before blood is taken from a donor, a complete history is taken to make sure that person has no history of diseases nor engages in risky social behavior (examples are intravenous drug use or sexual activity with multiple partners). The donor's travel history is screened to minimize risk of transmitting infections, such as malaria. The donated blood is tested for signs of infectious diseases, such as HIV and hepatitis. The blood is then tested to be sure it is compatible with you in order to minimize the chance of a transfusion reaction. If you or a relative donates blood, this is often done in anticipation of surgery and is not appropriate for emergency situations. It takes many days to process the donated blood. RISKS AND COMPLICATIONS Although transfusion therapy is very safe and saves many lives, the main dangers of transfusion include:   Getting an infectious disease.  Developing a transfusion reaction. This is an allergic reaction to something in the blood you were given. Every precaution is taken to prevent this. The decision to have a blood transfusion has been considered carefully by your caregiver before blood is given. Blood is not given unless the benefits outweigh the risks. AFTER THE TRANSFUSION  Right after receiving a blood transfusion, you will usually feel much better and more energetic. This is especially true if your red blood cells have gotten low (anemic). The transfusion raises the level of the red blood cells which carry oxygen, and this usually causes an  energy increase.  The nurse administering the transfusion will monitor you carefully for complications. HOME CARE INSTRUCTIONS  No special instructions are needed after a transfusion. You may find your energy is better. Speak with your caregiver about any limitations on activity for underlying diseases you may have. SEEK MEDICAL CARE IF:   Your condition is not improving after your transfusion.  You develop redness or irritation at the  intravenous (IV) site. SEEK IMMEDIATE MEDICAL CARE IF:  Any of the following symptoms occur over the next 12 hours:  Shaking chills.  You have a temperature by mouth above 102 F (38.9 C), not controlled by medicine.  Chest, back, or muscle pain.  People around you feel you are not acting correctly or are confused.  Shortness of breath or difficulty breathing.  Dizziness and fainting.  You get a rash or develop hives.  You have a decrease in urine output.  Your urine turns a dark color or changes to pink, red, or brown. Any of the following symptoms occur over the next 10 days:  You have a temperature by mouth above 102 F (38.9 C), not controlled by medicine.  Shortness of breath.  Weakness after normal activity.  The white part of the eye turns yellow (jaundice).  You have a decrease in the amount of urine or are urinating less often.  Your urine turns a dark color or changes to pink, red, or brown. Document Released: 05/03/2000 Document Revised: 07/29/2011 Document Reviewed: 12/21/2007 [facility] Patient Information 2014 [place], [place].  _______________________________________________________________________

## 2014-03-08 NOTE — Progress Notes (Signed)
Dr. Hassell Done - Patient's surgery is tomorrow Wed 10/21.  Please enter preop orders in epic.

## 2014-03-09 ENCOUNTER — Encounter (HOSPITAL_COMMUNITY): Payer: Self-pay | Admitting: *Deleted

## 2014-03-09 ENCOUNTER — Encounter (HOSPITAL_COMMUNITY): Payer: Medicare Other | Admitting: Anesthesiology

## 2014-03-09 ENCOUNTER — Other Ambulatory Visit (INDEPENDENT_AMBULATORY_CARE_PROVIDER_SITE_OTHER): Payer: Self-pay | Admitting: Surgery

## 2014-03-09 ENCOUNTER — Encounter (HOSPITAL_COMMUNITY)
Admission: RE | Disposition: A | Discharge status: Home / Self Care | Payer: Self-pay | Source: Ambulatory Visit | Attending: Surgery

## 2014-03-09 ENCOUNTER — Inpatient Hospital Stay (HOSPITAL_COMMUNITY)
Admission: RE | Admit: 2014-03-09 | Discharge: 2014-03-16 | DRG: 337 | Disposition: A | Discharge status: Home / Self Care | Payer: Medicare Other | Source: Ambulatory Visit | Attending: Surgery | Admitting: Surgery

## 2014-03-09 ENCOUNTER — Inpatient Hospital Stay (HOSPITAL_COMMUNITY): Payer: Medicare Other | Admitting: Anesthesiology

## 2014-03-09 DIAGNOSIS — Z87891 Personal history of nicotine dependence: Secondary | ICD-10-CM

## 2014-03-09 DIAGNOSIS — Z9884 Bariatric surgery status: Secondary | ICD-10-CM

## 2014-03-09 DIAGNOSIS — K439 Ventral hernia without obstruction or gangrene: Secondary | ICD-10-CM | POA: Diagnosis present

## 2014-03-09 DIAGNOSIS — Z9081 Acquired absence of spleen: Secondary | ICD-10-CM | POA: Diagnosis present

## 2014-03-09 DIAGNOSIS — Z6833 Body mass index (BMI) 33.0-33.9, adult: Secondary | ICD-10-CM | POA: Diagnosis not present

## 2014-03-09 DIAGNOSIS — E785 Hyperlipidemia, unspecified: Secondary | ICD-10-CM | POA: Diagnosis present

## 2014-03-09 DIAGNOSIS — Z9889 Other specified postprocedural states: Secondary | ICD-10-CM

## 2014-03-09 DIAGNOSIS — Z90410 Acquired total absence of pancreas: Secondary | ICD-10-CM | POA: Diagnosis present

## 2014-03-09 DIAGNOSIS — E039 Hypothyroidism, unspecified: Secondary | ICD-10-CM | POA: Diagnosis present

## 2014-03-09 DIAGNOSIS — E119 Type 2 diabetes mellitus without complications: Secondary | ICD-10-CM | POA: Diagnosis present

## 2014-03-09 DIAGNOSIS — I10 Essential (primary) hypertension: Secondary | ICD-10-CM | POA: Diagnosis present

## 2014-03-09 DIAGNOSIS — K660 Peritoneal adhesions (postprocedural) (postinfection): Secondary | ICD-10-CM | POA: Diagnosis present

## 2014-03-09 DIAGNOSIS — Z433 Encounter for attention to colostomy: Secondary | ICD-10-CM | POA: Diagnosis present

## 2014-03-09 HISTORY — PX: COLOSTOMY TAKEDOWN: SHX5258

## 2014-03-09 LAB — TYPE AND SCREEN
ABO/RH(D): AB POS
Antibody Screen: NEGATIVE

## 2014-03-09 LAB — CBC
HCT: 46 % (ref 36.0–46.0)
Hemoglobin: 15.3 g/dL — ABNORMAL HIGH (ref 12.0–15.0)
MCH: 27.2 pg (ref 26.0–34.0)
MCHC: 33.3 g/dL (ref 30.0–36.0)
MCV: 81.9 fL (ref 78.0–100.0)
PLATELETS: 399 10*3/uL (ref 150–400)
RBC: 5.62 MIL/uL — AB (ref 3.87–5.11)
RDW: 16 % — ABNORMAL HIGH (ref 11.5–15.5)
WBC: 23.2 10*3/uL — AB (ref 4.0–10.5)

## 2014-03-09 LAB — CREATININE, SERUM
CREATININE: 0.68 mg/dL (ref 0.50–1.10)
GFR calc non Af Amer: 89 mL/min — ABNORMAL LOW (ref >90)

## 2014-03-09 SURGERY — CLOSURE, COLOSTOMY, LAPAROSCOPIC
Anesthesia: General

## 2014-03-09 MED ORDER — PROPOFOL 10 MG/ML IV BOLUS
INTRAVENOUS | Status: DC | PRN
  Administered 2014-03-09: 150 mg via INTRAVENOUS

## 2014-03-09 MED ORDER — LACTATED RINGERS IV SOLN: INTRAVENOUS | Status: DC

## 2014-03-09 MED ORDER — FENTANYL CITRATE 0.05 MG/ML IJ SOLN
12.5000 ug | INTRAMUSCULAR | Status: DC | PRN
  Administered 2014-03-02 – 2014-03-09 (×5): 12.5 ug via INTRAVENOUS
  Filled 2014-03-09 (×3): qty 2

## 2014-03-09 MED ORDER — PROPOFOL 10 MG/ML IV BOLUS
INTRAVENOUS | Status: AC
  Filled 2014-03-09: qty 20

## 2014-03-09 MED ORDER — FENTANYL CITRATE 0.05 MG/ML IJ SOLN: 25.0000 ug | INTRAMUSCULAR | Status: DC | PRN

## 2014-03-09 MED ORDER — CHLORHEXIDINE GLUCONATE CLOTH 2 % EX PADS: 6.0000 | MEDICATED_PAD | Freq: Once | CUTANEOUS | Status: DC

## 2014-03-09 MED ORDER — MIDAZOLAM HCL 5 MG/5ML IJ SOLN
INTRAMUSCULAR | Status: DC | PRN
  Administered 2014-03-09: 2 mg via INTRAVENOUS

## 2014-03-09 MED ORDER — FENTANYL CITRATE 0.05 MG/ML IJ SOLN
INTRAMUSCULAR | Status: AC
  Filled 2014-03-09: qty 2

## 2014-03-09 MED ORDER — NEOSTIGMINE METHYLSULFATE 10 MG/10ML IV SOLN
INTRAVENOUS | Status: DC | PRN
  Administered 2014-03-09: 4 mg via INTRAVENOUS

## 2014-03-09 MED ORDER — MIDAZOLAM HCL 2 MG/2ML IJ SOLN
INTRAMUSCULAR | Status: AC
  Filled 2014-03-09: qty 2

## 2014-03-09 MED ORDER — ONDANSETRON HCL 4 MG/2ML IJ SOLN
INTRAMUSCULAR | Status: AC
  Filled 2014-03-09: qty 2

## 2014-03-09 MED ORDER — DEXAMETHASONE SODIUM PHOSPHATE 10 MG/ML IJ SOLN
INTRAMUSCULAR | Status: DC | PRN
  Administered 2014-03-09: 10 mg via INTRAVENOUS

## 2014-03-09 MED ORDER — HEPARIN SODIUM (PORCINE) 5000 UNIT/ML IJ SOLN
5000.0000 [IU] | Freq: Three times a day (TID) | INTRAMUSCULAR | Status: DC
  Administered 2014-03-10 – 2014-03-12 (×8): 5000 [IU] via SUBCUTANEOUS
  Filled 2014-03-09 (×11): qty 1

## 2014-03-09 MED ORDER — MEPERIDINE HCL 50 MG/ML IJ SOLN: 6.2500 mg | INTRAMUSCULAR | Status: DC | PRN

## 2014-03-09 MED ORDER — DEXTROSE 5 % IV SOLN
2.0000 g | Freq: Two times a day (BID) | INTRAVENOUS | Status: AC
  Administered 2014-03-10: 2 g via INTRAVENOUS
  Filled 2014-03-09: qty 2

## 2014-03-09 MED ORDER — DEXTROSE 5 % IV SOLN
2.0000 g | INTRAVENOUS | Status: AC
  Administered 2014-03-09: 2 g via INTRAVENOUS
  Filled 2014-03-09: qty 2

## 2014-03-09 MED ORDER — HYDROMORPHONE HCL 1 MG/ML IJ SOLN
INTRAMUSCULAR | Status: DC | PRN
  Administered 2014-03-09 (×5): .4 mg via INTRAVENOUS

## 2014-03-09 MED ORDER — FENTANYL CITRATE 0.05 MG/ML IJ SOLN
INTRAMUSCULAR | Status: AC
  Filled 2014-03-09: qty 5

## 2014-03-09 MED ORDER — ONDANSETRON HCL 4 MG PO TABS: 4.0000 mg | ORAL_TABLET | Freq: Four times a day (QID) | ORAL | Status: DC | PRN

## 2014-03-09 MED ORDER — ONDANSETRON HCL 4 MG/2ML IJ SOLN: 4.0000 mg | Freq: Four times a day (QID) | INTRAMUSCULAR | Status: DC | PRN

## 2014-03-09 MED ORDER — CISATRACURIUM BESYLATE 20 MG/10ML IV SOLN
INTRAVENOUS | Status: AC
  Filled 2014-03-09: qty 10

## 2014-03-09 MED ORDER — PROMETHAZINE HCL 25 MG/ML IJ SOLN: 6.2500 mg | INTRAMUSCULAR | Status: DC | PRN

## 2014-03-09 MED ORDER — 0.9 % SODIUM CHLORIDE (POUR BTL) OPTIME
TOPICAL | Status: DC | PRN
  Administered 2014-03-09: 4000 mL

## 2014-03-09 MED ORDER — GLYCOPYRROLATE 0.2 MG/ML IJ SOLN
INTRAMUSCULAR | Status: DC | PRN
Start: 2014-03-09 — End: 2014-03-09
  Administered 2014-03-09: .6 mg via INTRAVENOUS

## 2014-03-09 MED ORDER — HEPARIN SODIUM (PORCINE) 5000 UNIT/ML IJ SOLN
5000.0000 [IU] | Freq: Once | INTRAMUSCULAR | Status: AC
  Administered 2014-03-09: 5000 [IU] via SUBCUTANEOUS
  Filled 2014-03-09: qty 1

## 2014-03-09 MED ORDER — KCL IN DEXTROSE-NACL 20-5-0.45 MEQ/L-%-% IV SOLN
INTRAVENOUS | Status: DC
  Administered 2014-03-09: 23:00:00 via INTRAVENOUS
  Administered 2014-03-10: 100 mL/h via INTRAVENOUS
  Administered 2014-03-12 – 2014-03-15 (×5): via INTRAVENOUS
  Filled 2014-03-09 (×16): qty 1000

## 2014-03-09 MED ORDER — LACTATED RINGERS IV SOLN
INTRAVENOUS | Status: DC | PRN
  Administered 2014-03-09 (×3): via INTRAVENOUS

## 2014-03-09 MED ORDER — CISATRACURIUM BESYLATE (PF) 10 MG/5ML IV SOLN
INTRAVENOUS | Status: DC | PRN
  Administered 2014-03-09: 4 mg via INTRAVENOUS
  Administered 2014-03-09: 1 mg via INTRAVENOUS
  Administered 2014-03-09: 4 mg via INTRAVENOUS
  Administered 2014-03-09 (×2): 2 mg via INTRAVENOUS
  Administered 2014-03-09: 1 mg via INTRAVENOUS
  Administered 2014-03-09: 4 mg via INTRAVENOUS
  Administered 2014-03-09: 8 mg via INTRAVENOUS

## 2014-03-09 MED ORDER — ONDANSETRON HCL 4 MG/2ML IJ SOLN
INTRAMUSCULAR | Status: DC | PRN
  Administered 2014-03-09: 4 mg via INTRAVENOUS

## 2014-03-09 MED ORDER — METOCLOPRAMIDE HCL 5 MG/ML IJ SOLN
INTRAMUSCULAR | Status: DC | PRN
  Administered 2014-03-09: 10 mg via INTRAVENOUS

## 2014-03-09 MED ORDER — SODIUM CHLORIDE 0.9 % IJ SOLN
INTRAMUSCULAR | Status: AC
  Filled 2014-03-09: qty 10

## 2014-03-09 MED ORDER — METOCLOPRAMIDE HCL 5 MG/ML IJ SOLN
INTRAMUSCULAR | Status: AC
  Filled 2014-03-09: qty 2

## 2014-03-09 MED ORDER — DEXAMETHASONE SODIUM PHOSPHATE 10 MG/ML IJ SOLN
INTRAMUSCULAR | Status: AC
  Filled 2014-03-09: qty 1

## 2014-03-09 MED ORDER — FENTANYL CITRATE 0.05 MG/ML IJ SOLN
INTRAMUSCULAR | Status: DC | PRN
  Administered 2014-03-09: 100 ug via INTRAVENOUS
  Administered 2014-03-09: 50 ug via INTRAVENOUS
  Administered 2014-03-09 (×3): 100 ug via INTRAVENOUS
  Administered 2014-03-09 (×5): 50 ug via INTRAVENOUS

## 2014-03-09 MED ORDER — HYDROMORPHONE HCL 2 MG/ML IJ SOLN
INTRAMUSCULAR | Status: AC
  Filled 2014-03-09: qty 1

## 2014-03-09 SURGICAL SUPPLY — 89 items
APPLIER CLIP 5 13 M/L LIGAMAX5 (MISCELLANEOUS)
APPLIER CLIP ROT 10 11.4 M/L (STAPLE)
BINDER ABDOMINAL 12 ML 46-62 (SOFTGOODS) ×3 IMPLANT
BLADE EXTENDED COATED 6.5IN (ELECTRODE) ×3 IMPLANT
BLADE HEX COATED 2.75 (ELECTRODE) ×3 IMPLANT
BLADE SURG SZ10 CARB STEEL (BLADE) IMPLANT
CANISTER SUCTION 2500CC (MISCELLANEOUS) IMPLANT
CELLS DAT CNTRL 66122 CELL SVR (MISCELLANEOUS) IMPLANT
CLAMP ENDO BABCK 10MM (STAPLE) IMPLANT
CLIP APPLIE 5 13 M/L LIGAMAX5 (MISCELLANEOUS) IMPLANT
CLIP APPLIE ROT 10 11.4 M/L (STAPLE) IMPLANT
CLOSURE WOUND 1/2 X4 (GAUZE/BANDAGES/DRESSINGS)
CONNECTOR 5 IN 1 STRAIGHT STRL (MISCELLANEOUS) IMPLANT
COVER MAYO STAND STRL (DRAPES) ×3 IMPLANT
COVER SURGICAL LIGHT HANDLE (MISCELLANEOUS) ×3 IMPLANT
DECANTER SPIKE VIAL GLASS SM (MISCELLANEOUS) ×3 IMPLANT
DERMABOND ADVANCED (GAUZE/BANDAGES/DRESSINGS) ×2
DERMABOND ADVANCED .7 DNX12 (GAUZE/BANDAGES/DRESSINGS) ×1 IMPLANT
DEVICE TROCAR PUNCTURE CLOSURE (ENDOMECHANICALS) IMPLANT
DRAIN CHANNEL 19F RND (DRAIN) ×3 IMPLANT
DRAPE LAPAROSCOPIC ABDOMINAL (DRAPES) ×3 IMPLANT
DRAPE SHEET LG 3/4 BI-LAMINATE (DRAPES) IMPLANT
DRAPE WARM FLUID 44X44 (DRAPE) ×3 IMPLANT
ELECT REM PT RETURN 9FT ADLT (ELECTROSURGICAL) ×3
ELECTRODE REM PT RTRN 9FT ADLT (ELECTROSURGICAL) ×1 IMPLANT
EVACUATOR SILICONE 100CC (DRAIN) ×3 IMPLANT
GAUZE SPONGE 4X4 12PLY STRL (GAUZE/BANDAGES/DRESSINGS) ×3 IMPLANT
GLOVE BIOGEL M 8.0 STRL (GLOVE) ×6 IMPLANT
GLOVE BIOGEL PI IND STRL 7.0 (GLOVE) ×1 IMPLANT
GLOVE BIOGEL PI INDICATOR 7.0 (GLOVE) ×2
GOWN SPEC L4 XLG W/TWL (GOWN DISPOSABLE) ×3 IMPLANT
GOWN STRL REUS W/TWL LRG LVL3 (GOWN DISPOSABLE) ×3 IMPLANT
GOWN STRL REUS W/TWL XL LVL3 (GOWN DISPOSABLE) ×9 IMPLANT
KIT BASIN OR (CUSTOM PROCEDURE TRAY) ×3 IMPLANT
LEGGING LITHOTOMY PAIR STRL (DRAPES) IMPLANT
LIGASURE IMPACT 36 18CM CVD LR (INSTRUMENTS) IMPLANT
NS IRRIG 1000ML POUR BTL (IV SOLUTION) ×6 IMPLANT
PAD ABD 8X10 STRL (GAUZE/BANDAGES/DRESSINGS) ×3 IMPLANT
PENCIL BUTTON HOLSTER BLD 10FT (ELECTRODE) ×3 IMPLANT
RELOAD PROXIMATE 75MM BLUE (ENDOMECHANICALS) ×6 IMPLANT
RETRACTOR WND ALEXIS 25 LRG (MISCELLANEOUS) ×1 IMPLANT
RTRCTR WOUND ALEXIS 18CM MED (MISCELLANEOUS)
RTRCTR WOUND ALEXIS 25CM LRG (MISCELLANEOUS) ×3
SCISSORS LAP 5X35 DISP (ENDOMECHANICALS) IMPLANT
SEALER TISSUE G2 CVD JAW 35 (ENDOMECHANICALS) IMPLANT
SEALER TISSUE G2 CVD JAW 45CM (ENDOMECHANICALS)
SET IRRIG TUBING LAPAROSCOPIC (IRRIGATION / IRRIGATOR) IMPLANT
SHEARS HARMONIC ACE PLUS 36CM (ENDOMECHANICALS) ×3 IMPLANT
SLEEVE Z-THREAD 5X100MM (TROCAR) IMPLANT
SOLUTION ANTI FOG 6CC (MISCELLANEOUS) ×3 IMPLANT
SPONGE DRAIN TRACH 4X4 STRL 2S (GAUZE/BANDAGES/DRESSINGS) ×3 IMPLANT
SPONGE LAP 18X18 X RAY DECT (DISPOSABLE) ×12 IMPLANT
STAPLER PROXIMATE 75MM BLUE (STAPLE) ×3 IMPLANT
STAPLER VISISTAT 35W (STAPLE) ×3 IMPLANT
STRIP CLOSURE SKIN 1/2X4 (GAUZE/BANDAGES/DRESSINGS) IMPLANT
SUCTION POOLE TIP (SUCTIONS) ×3 IMPLANT
SUT ETHILON 2 0 PS N (SUTURE) ×3 IMPLANT
SUT NOVA 1 T20/GS 25DT (SUTURE) ×24 IMPLANT
SUT PDS AB 1 CT1 27 (SUTURE) IMPLANT
SUT PDS AB 1 CTX 36 (SUTURE) ×3 IMPLANT
SUT PDS AB 4-0 SH 27 (SUTURE) ×6 IMPLANT
SUT PROLENE 2 0 KS (SUTURE) IMPLANT
SUT SILK 2 0 (SUTURE) ×2
SUT SILK 2 0 SH CR/8 (SUTURE) ×3 IMPLANT
SUT SILK 2-0 18XBRD TIE 12 (SUTURE) ×1 IMPLANT
SUT SILK 3 0 (SUTURE) ×2
SUT SILK 3 0 SH CR/8 (SUTURE) ×9 IMPLANT
SUT SILK 3-0 18XBRD TIE 12 (SUTURE) ×1 IMPLANT
SUT VIC AB 2-0 CT1 27 (SUTURE)
SUT VIC AB 2-0 CT1 27XBRD (SUTURE) IMPLANT
SUT VIC AB 2-0 SH 18 (SUTURE) ×3 IMPLANT
SUT VIC AB 3-0 PS2 18 (SUTURE)
SUT VIC AB 3-0 PS2 18XBRD (SUTURE) IMPLANT
SUT VIC AB 4-0 SH 18 (SUTURE) IMPLANT
SUT VICRYL 0 UR6 27IN ABS (SUTURE) IMPLANT
SYR 30ML LL (SYRINGE) ×3 IMPLANT
SYR BULB IRRIGATION 50ML (SYRINGE) ×3 IMPLANT
TAPE CLOTH SURG 4X10 WHT LF (GAUZE/BANDAGES/DRESSINGS) ×3 IMPLANT
TOWEL OR 17X26 10 PK STRL BLUE (TOWEL DISPOSABLE) ×3 IMPLANT
TRAY FOLEY CATH 14FRSI W/METER (CATHETERS) ×3 IMPLANT
TRAY LAP CHOLE (CUSTOM PROCEDURE TRAY) ×3 IMPLANT
TROCAR BLADELESS OPT 5 100 (ENDOMECHANICALS) ×3 IMPLANT
TROCAR XCEL NON-BLD 11X100MML (ENDOMECHANICALS) IMPLANT
TROCAR XCEL UNIV SLVE 11M 100M (ENDOMECHANICALS) IMPLANT
TUBING CONNECTING 10 (TUBING) IMPLANT
TUBING CONNECTING 10' (TUBING)
TUBING FILTER THERMOFLATOR (ELECTROSURGICAL) ×3 IMPLANT
YANKAUER SUCT BULB TIP 10FT TU (MISCELLANEOUS) ×3 IMPLANT
YANKAUER SUCT BULB TIP NO VENT (SUCTIONS) ×3 IMPLANT

## 2014-03-09 NOTE — Transfer of Care (Signed)
Immediate Anesthesia Transfer of Care Note  Patient: Carol Jackson  Procedure(s) Performed: Procedure(s): LAPAROSCOPIC/OPEN TAKEDOWN OF HARTMAN POUCH (N/A)  Patient Location: PACU  Anesthesia Type:General  Level of Consciousness: awake  Airway & Oxygen Therapy: Patient Spontanous Breathing and Patient connected to face mask oxygen  Post-op Assessment: Report given to PACU RN and Post -op Vital signs reviewed and stable  Post vital signs: Reviewed and stable  Complications: No apparent anesthesia complications

## 2014-03-09 NOTE — Brief Op Note (Signed)
03/09/2014  6:28 PM  PATIENT:  Carol Jackson  68 y.o. female  PRE-OPERATIVE DIAGNOSIS:  hartman pouch  POST-OPERATIVE DIAGNOSIS:  hartman pouch  PROCEDURE:  Procedure(s): LAPAROSCOPIC/OPEN TAKEDOWN OF HARTMAN POUCH (N/A)  SURGEON:  Surgeon(s) and Role:    * Pedro Earls, MD - Primary    * Excell Seltzer, MD - Assisting  PHYSICIAN ASSISTANT:   ASSISTANTS: Adonis Housekeeper, MD, FACS   ANESTHESIA:   general  EBL:  Total I/O In: 1497 [I.V.:3200; IV Piggyback:50] Out: 375 [Urine:175; Blood:200]  BLOOD ADMINISTERED:none  DRAINS: (1) Jackson-Pratt drain(s) with closed bulb suction in the subcutaneous space of anterior abdominal wall closure   LOCAL MEDICATIONS USED:  NONE  SPECIMEN:  No Specimen  DISPOSITION OF SPECIMEN:  N/A  COUNTS:  YES  TOURNIQUET:  * No tourniquets in log *  DICTATION: .Other Dictation: Dictation Number [id number]  PLAN OF CARE: Admit to inpatient   PATIENT DISPOSITION:  PACU - hemodynamically stable.   Delay start of Pharmacological VTE agent (>24hrs) due to surgical blood loss or risk of bleeding: no

## 2014-03-09 NOTE — H&P (Signed)
Chief Complaint:  Transverse colostomy for takedown  History of Present Illness:  Carol Jackson is an 68 y.o. female who underwent lapband removal and conversion to roux Y gastric bypass in June 2015.  She had a prior history of a distal pancreatectomy and splenectomy and this lead to her complication which was a transection of her transverse colon subsequently requiring a colostomy.  That procedure was complicated by a wound infection which required open drainage and debridement.  It has been 4 months and she desires ostomy takedown.    Past Medical History  Diagnosis Date  . Hyperlipidemia   . Hypertension   . Thyroid disease     hypothyroidism  . Morbid obesity   . Pneumonia     hxof 2013 had booster for pneumonia 09/2013   . Arthritis     back   . Hypothyroidism   . Diabetes mellitus without complication     PT STATES SHE NO LONGER HAS DIABETES - LOST WEIGHT AND PROBLEM WITH ELEVATED BLOOD SUGAR RESOLVED  . GERD (gastroesophageal reflux disease)     NO LONGER A PROBLEM AFTER WEIGHT LOSS    Past Surgical History  Procedure Laterality Date  . Laparoscopic gastric banding  04/03/04  . Abdominal hysterectomy  01/15/00  . Bladder suspension  11/08/08  . 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 Pancreas surgery  09/24/11    tail end removed  . Lung surgery  05/06/12    INFECTION ON OUTSIDE OF LUNG - SURGERY TO SCRAPE   . Back surgery  1982 adn 1985     discs removed lumbar area   . Colonoscopy  2014   . 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 Laparoscopy N/A 10/24/2013    Procedure: LAPAROSCOPY DIAGNOSTIC, EXPLORATORY LAPAROTOMY, LEFT TRANSVERSE COLON RESECTION WITH COLOSTOMY, UPPER ENDOSCOPY;  Surgeon: Shann Medal, MD;  Location: [facility];  Service: General;  Laterality: N/A;  . Central venous catheter insertion  Right 10/24/2013    Procedure: INSERTION CENTRAL LINE ADULT;  Surgeon: Shann Medal, MD;  Location: [facility];  Service: General;  Laterality: Right;  . Incision and drainage of wound N/A 11/28/2013    Procedure: IRRIGATION AND DEBRIDEMENT ABDOMINAL INCISION;  Surgeon: Edward Jolly, MD;  Location: [facility];  Service: General;  Laterality: N/A;    Current Facility-Administered Medications  Medication Dose Route Frequency Provider Last Rate Last Dose  . cefoTEtan (CEFOTAN) 2 g in dextrose 5 % 50 mL IVPB  2 g Intravenous On Call to Bayard, MD      . Chlorhexidine Gluconate Cloth 2 % PADS 6 each  6 each Topical Once Pedro Earls, MD       Facility-Administered Medications Ordered in Other Encounters  Medication Dose Route Frequency Provider Last Rate Last Dose  . lactated ringers infusion    Continuous PRN Dione Booze, CRNA       Morphine and related and Levaquin Family History  Problem Relation Age of Onset  . Heart disease Father    Social History:   reports that she quit smoking about 30 years ago. Her smoking use included Cigarettes. She smoked 0.00 packs per day. She has never used smokeless tobacco. She reports that she drinks alcohol. She reports that she does not use illicit drugs.   REVIEW OF SYSTEMS :  Negative except for HPI-DM resolved at roux Y  Physical Exam:   Blood pressure 159/77, pulse 88, temperature 97.5 F (36.4 C), temperature source Oral, resp. rate 18, height 5\' 5"  (1.651 m), weight 203 lb (92.08 kg), SpO2 100.00%. Body mass index is 33.78 kg/(m^2).  Gen:  WDWN WF NAD  Neurological: Alert and oriented to person, place, and time. Motor and sensory function is grossly intact  Head: Normocephalic and atraumatic.  Eyes: Conjunctivae are normal. Pupils are equal, round, and reactive to light. No scleral icterus.  Neck: Normal range of motion. Neck supple. No tracheal deviation or thyromegaly present.  Cardiovascular:  SR without murmurs or  gallops.  No carotid bruits Breast:  Not examined Respiratory: Effort normal.  No respiratory distress. No chest wall tenderness. Breath sounds normal.  No wheezes, rales or rhonchi.  Abdomen:  Right upper quadrant colostomy GU:  Not examined Musculoskeletal: Normal range of motion. Extremities are nontender. No cyanosis, edema or clubbing noted Lymphadenopathy: No cervical, preauricular, postauricular or axillary adenopathy is present Skin: Skin is warm and dry. No rash noted. No diaphoresis. No erythema. No pallor. Pscyh: Normal mood and affect. Behavior is normal. Judgment and thought content normal.   LABORATORY RESULTS: No results found for this or any previous visit (from the past 48 hour(s)).   RADIOLOGY RESULTS: No results found.  Problem List: Patient Active Problem List   Diagnosis Date Noted  . Postoperative wound infection 11/17/2013  . 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 Diabetes 09/29/2013  . Thoracotomy scar of right chest-decortication at [facility] 09/29/2013  . Post-pancreatectomy diabetes-splenectomy at [facility] 09/29/2013  . 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 Obesity, unspecified 10/22/2012    Assessment & Plan: Transverse colostomy for takedown.      Matt B. Hassell Done, MD, [facility] [phone] beeper [phone]  03/09/2014 1:30 PM

## 2014-03-09 NOTE — Anesthesia Preprocedure Evaluation (Addendum)
Anesthesia Evaluation  Patient identified by MRN, date of birth, ID band Patient awake    Reviewed: Allergy & Precautions, H&P , NPO status , Patient's Chart, lab work & pertinent test results  Airway Mallampati: II TM Distance: >3 FB Neck ROM: Full    Dental no notable dental hx. (+) Teeth Intact, Dental Advisory Given   Pulmonary former smoker,  breath sounds clear to auscultation  Pulmonary exam normal       Cardiovascular hypertension, Pt. on medications Rhythm:Regular Rate:Normal     Neuro/Psych negative neurological ROS  negative psych ROS   GI/Hepatic negative GI ROS, Neg liver ROS,   Endo/Other  diabetes, Type 2Hypothyroidism Morbid obesity  Renal/GU negative Renal ROS  negative genitourinary   Musculoskeletal negative musculoskeletal ROS (+)   Abdominal   Peds negative pediatric ROS (+)  Hematology negative hematology ROS (+)   Anesthesia Other Findings   Reproductive/Obstetrics negative OB ROS                          Anesthesia Physical  Anesthesia Plan  ASA: II  Anesthesia Plan: General   Post-op Pain Management:    Induction: Intravenous  Airway Management Planned: Oral ETT  Additional Equipment:   Intra-op Plan:   Post-operative Plan:   Informed Consent: I have reviewed the patients History and Physical, chart, labs and discussed the procedure including the risks, benefits and alternatives for the proposed anesthesia with the patient or authorized representative who has indicated his/her understanding and acceptance.   Dental advisory given  Plan Discussed with: CRNA  Anesthesia Plan Comments:        Anesthesia Quick Evaluation

## 2014-03-09 NOTE — Interval H&P Note (Signed)
History and Physical Interval Note:  03/09/2014 1:34 PM  Carol Jackson  has presented today for surgery, with the diagnosis of hartman pouch  The various methods of treatment have been discussed with the patient and family. After consideration of risks, benefits and other options for treatment, the patient has consented to  Procedure(s): LAPAROSCOPIC/OPEN TAKEDOWN OF [facility] POUCH (N/A) as a surgical intervention .  The patient's history has been reviewed, patient examined, no change in status, stable for surgery.  I have reviewed the patient's chart and labs.  Questions were answered to the patient's satisfaction.     [name]

## 2014-03-09 NOTE — Anesthesia Postprocedure Evaluation (Signed)
  Anesthesia Post-op Note  Anesthesia Post Note  Patient: Carol Jackson  Procedure(s) Performed: Procedure(s) (LRB): LAPAROSCOPIC/OPEN TAKEDOWN OF HARTMAN POUCH (N/A)  Anesthesia type: General  Patient location: PACU  Post pain: Pain level controlled  Post assessment: Post-op Vital signs reviewed  Last Vitals:  Filed Vitals:   03/09/14 1900  BP:   Pulse:   Temp: 36.6 C  Resp:     Post vital signs: Reviewed  Level of consciousness: sedated  Complications: No apparent anesthesia complications

## 2014-03-10 ENCOUNTER — Encounter (HOSPITAL_COMMUNITY): Payer: Self-pay | Admitting: *Deleted

## 2014-03-10 LAB — BASIC METABOLIC PANEL
ANION GAP: 12 (ref 5–15)
BUN: 15 mg/dL (ref 6–23)
CALCIUM: 8.5 mg/dL (ref 8.4–10.5)
CO2: 25 mEq/L (ref 19–32)
CREATININE: 0.82 mg/dL (ref 0.50–1.10)
Chloride: 101 mEq/L (ref 96–112)
GFR, EST AFRICAN AMERICAN: 84 mL/min — AB (ref >90)
GFR, EST NON AFRICAN AMERICAN: 72 mL/min — AB (ref >90)
Glucose, Bld: 258 mg/dL — ABNORMAL HIGH (ref 70–99)
Potassium: 4 mEq/L (ref 3.7–5.3)
Sodium: 138 mEq/L (ref 137–147)

## 2014-03-10 LAB — CBC
HCT: 39.9 % (ref 36.0–46.0)
Hemoglobin: 12.8 g/dL (ref 12.0–15.0)
MCH: 26.6 pg (ref 26.0–34.0)
MCHC: 32.1 g/dL (ref 30.0–36.0)
MCV: 83 fL (ref 78.0–100.0)
PLATELETS: 340 10*3/uL (ref 150–400)
RBC: 4.81 MIL/uL (ref 3.87–5.11)
RDW: 16 % — AB (ref 11.5–15.5)
WBC: 18.4 10*3/uL — ABNORMAL HIGH (ref 4.0–10.5)

## 2014-03-10 MED ORDER — FENTANYL CITRATE 0.05 MG/ML IJ SOLN
25.0000 ug | INTRAMUSCULAR | Status: DC | PRN
  Administered 2014-03-10 – 2014-03-14 (×31): 25 ug via INTRAVENOUS
  Filled 2014-03-10 (×30): qty 2

## 2014-03-10 NOTE — Progress Notes (Signed)
Inpatient Diabetes Program Recommendations  AACE/ADA: New Consensus Statement on Inpatient Glycemic Control (2013)  Target Ranges:  Prepandial:   less than 140 mg/dL      Peak postprandial:   less than 180 mg/dL (1-2 hours)      Critically ill patients:  140 - 180 mg/dL     Results for Carol Jackson, Carol Jackson (MRN [id number]) as of 03/10/2014 10:19  Ref. Range 03/10/2014 05:15  Glucose Latest Range: 70-99 mg/dL 258 (H)     Admitted for Colostomy takedown.  Has History of DM however, "PT STATES SHE NO LONGER HAS DIABETES - LOST WEIGHT AND PROBLEM WITH ELEVATED BLOOD SUGAR RESOLVED".  Lab glucose elevated this AM.   MD- Please consider placing order for CBG checks Q4 hours and cover with Novolog Sensitive SSI Q4 hours if CBGs elevated     Will follow Wyn Quaker RN, MSN, CDE Diabetes Coordinator Inpatient Diabetes Program Team Pager: [phone] (8a-10p)

## 2014-03-10 NOTE — Care Management Note (Signed)
    Page 1 of 1   03/10/2014     12:07:52 PM CARE MANAGEMENT NOTE 03/10/2014  Patient:  Carol Jackson,Carol Jackson   Account Number:  [id number]  Date Initiated:  03/10/2014  Documentation initiated by:  Sunday Spillers  Subjective/Objective Assessment:   68 yo female admitted s/p ostomy takedown. PTA lived at home with spouse.     Action/Plan:   Home when stable   Anticipated DC Date:  03/10/2014   Anticipated DC Plan:  [place]  CM consult      Choice offered to / List presented to:             Status of service:  Completed, signed off Medicare Important Message given?   (If response is "NO", the following Medicare IM given date fields will be blank) Date Medicare IM given:   Medicare IM given by:   Date Additional Medicare IM given:   Additional Medicare IM given by:    Discharge Disposition:  HOME/SELF CARE  Per UR Regulation:  Reviewed for med. necessity/level of care/duration of stay  If discussed at [place] of Stay Meetings, dates discussed:    Comments:

## 2014-03-10 NOTE — Op Note (Signed)
Carol Jackson           ACCOUNT NO.:  [id number]  MEDICAL RECORD NO.:  [id number]  LOCATION:  1528                         FACILITY:  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  PROCEDURE:  Laparoscopy, open enterolysis and takedown of long Hartmann pouch with resection of portion of pouch, takedown of transverse colostomy and primary end-to-end, hand-sewn, double-layer anastomosis; repair of ventral hernia with left anterior abdominal relaxing incision.  SURGEON:  Carol Jackson  ASSISTANT:  Carol Jackson, M.D.  OPERATIVE TIME:  4 hours.  DESCRIPTION OF PROCEDURE:  Carol Jackson was taken to room 1 on the afternoon of Wednesday, March 09, 2014, and given general anesthesia. Preoperatively, she had a bowel prep.  She received __________ preop.  I attempted entry into the abdomen through the left upper quadrant and got in and insufflated the abdomen.  There were a lot of adhesions and I could not really see anything very well.  I was able to put a second trocar and below and again found abdomen was pretty well adherent with filmy adhesions.  I therefore went ahead and excised her old scar and carried this down, and entered the midline without difficulty.  We then began a rather extensive enterolysis of the small bowel and worked our way up to the left upper quadrant.  When this was completed, I found that the suture that Carol Jackson had left in the descending colon as it ascended up into __________ splenic flexure and now was the left colon stuck up to the diaphragm and up to the gastric remnant.  I went ahead and mobilized this and then took it off the gastric remnant.  The pouch was intact.  We did not disturb that and we appreciated the  anatomy including the gastrojejunostomy, which appeared to have healed fine. The colon was then brought down to the level of the transverse colon. The transverse colon was then mobilized by excising the ostomy, which had been closed with a pursestring suture and completely mobilized not entering and taking it down off the fascia.  I was then brought into the abdomen and then the ends were freed up actually stapling off at a level where we felt like we had good healthy colon.  We had really a tension- free anastomosis that was kind of anterior to the small bowel, so we then aligned those and I did an end-to-end, 2-layer anastomosis using 3- 0 silk on the back row, opening along the colon and into an inner layer of 4-0 PDS in a running-locking fashion posteriorly and anteriorly in a canal Mayo fashion.  Second layer of seromuscular sutures were placed to complete that anastomosis.  It looked good.  We irrigated the abdomen with copious amounts of saline.  We checked the small bowel and there were filmy adhesions, but we took down anything that could possibly be a potential small bowel obstructing adhesion.  Then, we took down the hernia sac as this previously infected wound had formed  a fascial hernia and we had a fairly good gap to try to make up.  To do that, I went ahead and did a relaxing incision over on the left side lateral to the rectus.  In addition, we closed the ostomy site first on the inside with a running 0 PDS and with interrupted #1 Novafil on the fascia.  I then began a closure of the anterior abdominal fascia with many #1 Novafil pop offs, which were placed and tied.  Once completed, the area was irrigated.  Again, the drain was placed and the subcutaneous area was approximated with some 2-0 Vicryl and staples.  The patient seemed to tolerate the procedure well.  She was taken to the recovery room in satisfactory condition.     Carol Caprice Hassell Done, Jackson     MBM/MEDQ   D:  03/09/2014  T:  03/10/2014  Job:  [id number]  cc:   Carol Maid, Jackson Fax: [phone]

## 2014-03-11 LAB — CBC
HEMATOCRIT: 30.1 % — AB (ref 36.0–46.0)
Hemoglobin: 10 g/dL — ABNORMAL LOW (ref 12.0–15.0)
MCH: 27.3 pg (ref 26.0–34.0)
MCHC: 33.2 g/dL (ref 30.0–36.0)
MCV: 82.2 fL (ref 78.0–100.0)
PLATELETS: 283 10*3/uL (ref 150–400)
RBC: 3.66 MIL/uL — ABNORMAL LOW (ref 3.87–5.11)
RDW: 16 % — AB (ref 11.5–15.5)
WBC: 19.4 10*3/uL — AB (ref 4.0–10.5)

## 2014-03-11 LAB — BASIC METABOLIC PANEL
Anion gap: 12 (ref 5–15)
BUN: 25 mg/dL — AB (ref 6–23)
CHLORIDE: 101 meq/L (ref 96–112)
CO2: 24 mEq/L (ref 19–32)
CREATININE: 0.83 mg/dL (ref 0.50–1.10)
Calcium: 8.1 mg/dL — ABNORMAL LOW (ref 8.4–10.5)
GFR, EST AFRICAN AMERICAN: 83 mL/min — AB (ref >90)
GFR, EST NON AFRICAN AMERICAN: 71 mL/min — AB (ref >90)
Glucose, Bld: 156 mg/dL — ABNORMAL HIGH (ref 70–99)
POTASSIUM: 4.3 meq/L (ref 3.7–5.3)
Sodium: 137 mEq/L (ref 137–147)

## 2014-03-11 MED ORDER — LACTATED RINGERS IV BOLUS (SEPSIS)
1000.0000 mL | Freq: Once | INTRAVENOUS | Status: AC
Start: 2014-03-11 — End: 2014-03-11
  Administered 2014-03-11: 1000 mL via INTRAVENOUS

## 2014-03-11 NOTE — Progress Notes (Signed)
Patient ID: Carol Jackson, female   DOB: 1946-03-14, 68 y.o.   MRN: [id number] [facility] Surgery Progress Note:   2 Days Post-Op  Subjective: Mental status is clear.  Back was stiff and she is sitting up.   Objective: Vital signs in last 24 hours: Temp:  [97.7 F (36.5 C)-99.1 F (37.3 C)] 97.7 F (36.5 C) (10/23 0543) Pulse Rate:  [80-89] 81 (10/23 0543) Resp:  [16-18] 16 (10/23 0543) BP: (146-170)/(73-113) 170/78 mmHg (10/23 0543) SpO2:  [95 %-100 %] 97 % (10/23 0543)  Intake/Output from previous day: 10/22 0701 - 10/23 0700 In: 1995 [I.V.:1995] Out: 530 [Urine:500; Drains:30] Intake/Output this shift:    Physical Exam: Work of breathing is not elevated.  Dressings dry.  JP with some bloody drainage.    Lab Results:  Results for orders placed during the hospital encounter of 03/09/14 (from the past 48 hour(s))  CBC     Status: Abnormal   Collection Time    03/09/14  8:44 PM      Result Value Ref Range   WBC 23.2 (*) 4.0 - 10.5 K/uL   RBC 5.62 (*) 3.87 - 5.11 MIL/uL   Hemoglobin 15.3 (*) 12.0 - 15.0 g/dL   HCT 46.0  36.0 - 46.0 %   MCV 81.9  78.0 - 100.0 fL   MCH 27.2  26.0 - 34.0 pg   MCHC 33.3  30.0 - 36.0 g/dL   RDW 16.0 (*) 11.5 - 15.5 %   Platelets 399  150 - 400 K/uL  CREATININE, SERUM     Status: Abnormal   Collection Time    03/09/14  8:44 PM      Result Value Ref Range   Creatinine, Ser 0.68  0.50 - 1.10 mg/dL   GFR calc non Af Amer 89 (*) >90 mL/min   GFR calc Af Amer >90  >90 mL/min   Comment: (NOTE)     The eGFR has been calculated using the CKD EPI equation.     This calculation has not been validated in all clinical situations.     eGFR's persistently <90 mL/min signify possible Chronic Kidney     Disease.  BASIC METABOLIC PANEL     Status: Abnormal   Collection Time    03/10/14  5:15 AM      Result Value Ref Range   Sodium 138  137 - 147 mEq/L   Potassium 4.0  3.7 - 5.3 mEq/L   Chloride 101  96 - 112 mEq/L   CO2 25  19 - 32 mEq/L   Glucose, Bld 258 (*) 70 - 99 mg/dL   BUN 15  6 - 23 mg/dL   Creatinine, Ser 0.82  0.50 - 1.10 mg/dL   Calcium 8.5  8.4 - 10.5 mg/dL   GFR calc non Af Amer 72 (*) >90 mL/min   GFR calc Af Amer 84 (*) >90 mL/min   Comment: (NOTE)     The eGFR has been calculated using the CKD EPI equation.     This calculation has not been validated in all clinical situations.     eGFR's persistently <90 mL/min signify possible Chronic Kidney     Disease.   Anion gap 12  5 - 15  CBC     Status: Abnormal   Collection Time    03/10/14  5:15 AM      Result Value Ref Range   WBC 18.4 (*) 4.0 - 10.5 K/uL   RBC 4.81  3.87 - 5.11 MIL/uL  Hemoglobin 12.8  12.0 - 15.0 g/dL   HCT 39.9  36.0 - 46.0 %   MCV 83.0  78.0 - 100.0 fL   MCH 26.6  26.0 - 34.0 pg   MCHC 32.1  30.0 - 36.0 g/dL   RDW 16.0 (*) 11.5 - 15.5 %   Platelets 340  150 - 400 K/uL  CBC     Status: Abnormal   Collection Time    03/11/14  4:57 AM      Result Value Ref Range   WBC 19.4 (*) 4.0 - 10.5 K/uL   RBC 3.66 (*) 3.87 - 5.11 MIL/uL   Hemoglobin 10.0 (*) 12.0 - 15.0 g/dL   Comment: DELTA CHECK NOTED     REPEATED TO VERIFY   HCT 30.1 (*) 36.0 - 46.0 %   MCV 82.2  78.0 - 100.0 fL   MCH 27.3  26.0 - 34.0 pg   MCHC 33.2  30.0 - 36.0 g/dL   RDW 16.0 (*) 11.5 - 15.5 %   Platelets 283  150 - 400 K/uL  BASIC METABOLIC PANEL     Status: Abnormal   Collection Time    03/11/14  4:57 AM      Result Value Ref Range   Sodium 137  137 - 147 mEq/L   Potassium 4.3  3.7 - 5.3 mEq/L   Chloride 101  96 - 112 mEq/L   CO2 24  19 - 32 mEq/L   Glucose, Bld 156 (*) 70 - 99 mg/dL   BUN 25 (*) 6 - 23 mg/dL   Creatinine, Ser 0.83  0.50 - 1.10 mg/dL   Calcium 8.1 (*) 8.4 - 10.5 mg/dL   GFR calc non Af Amer 71 (*) >90 mL/min   GFR calc Af Amer 83 (*) >90 mL/min   Comment: (NOTE)     The eGFR has been calculated using the CKD EPI equation.     This calculation has not been validated in all clinical situations.     eGFR's persistently <90 mL/min signify  possible Chronic Kidney     Disease.   Anion gap 12  5 - 15    Radiology/Results: No results found.  Anti-infectives: Anti-infectives   Start     Dose/Rate Route Frequency Ordered Stop   03/10/14 0200  cefoTEtan (CEFOTAN) 2 g in dextrose 5 % 50 mL IVPB     2 g 100 mL/hr over 30 Minutes Intravenous Every 12 hours 03/09/14 1957 03/10/14 0146   03/09/14 1100  cefoTEtan (CEFOTAN) 2 g in dextrose 5 % 50 mL IVPB     2 g 100 mL/hr over 30 Minutes Intravenous On call to O.R. 03/09/14 1058 03/09/14 1355      Assessment/Plan: Problem List: Patient Active Problem List   Diagnosis Date Noted  . S/P colostomy takedown Oct 2015 03/09/2014  . Postoperative wound infection 11/17/2013  . 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 Diabetes 09/29/2013  . Thoracotomy scar of right chest-decortication at [facility] 09/29/2013  . 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 Obesity, unspecified 10/22/2012    Urine output was low yesterday and fluid bolus given.  Foley reinserted.  HG down to 10 either blood loss or dilutional.  Will increase IV fluids and observe.  No flatus yet.  Will offer some ice chips and popsicles 2 Days Post-Op    LOS: 2 days   Matt B. Hassell Done, MD,  [facility] [phone] beeper [phone]  03/11/2014 8:33 AM

## 2014-03-11 NOTE — Progress Notes (Signed)
Carol Jackson continue to have low urine out put had 200 cc out in 6hr.. At 0330 Bladder scan showed only 100cc in bladder.  Notified Dr. Johney Maine, Orders received for 1 Liter of LR once.

## 2014-03-12 LAB — BASIC METABOLIC PANEL
ANION GAP: 9 (ref 5–15)
BUN: 12 mg/dL (ref 6–23)
CALCIUM: 8.3 mg/dL — AB (ref 8.4–10.5)
CO2: 26 mEq/L (ref 19–32)
Chloride: 104 mEq/L (ref 96–112)
Creatinine, Ser: 0.62 mg/dL (ref 0.50–1.10)
GFR calc Af Amer: >90 mL/min (ref >90)
GFR calc non Af Amer: >90 mL/min (ref >90)
Glucose, Bld: 132 mg/dL — ABNORMAL HIGH (ref 70–99)
POTASSIUM: 4 meq/L (ref 3.7–5.3)
Sodium: 139 mEq/L (ref 137–147)

## 2014-03-12 LAB — CBC
HCT: 26.7 % — ABNORMAL LOW (ref 36.0–46.0)
Hemoglobin: 8.9 g/dL — ABNORMAL LOW (ref 12.0–15.0)
MCH: 27.2 pg (ref 26.0–34.0)
MCHC: 33.3 g/dL (ref 30.0–36.0)
MCV: 81.7 fL (ref 78.0–100.0)
PLATELETS: 275 10*3/uL (ref 150–400)
RBC: 3.27 MIL/uL — AB (ref 3.87–5.11)
RDW: 16.1 % — ABNORMAL HIGH (ref 11.5–15.5)
WBC: 15.9 10*3/uL — ABNORMAL HIGH (ref 4.0–10.5)

## 2014-03-12 NOTE — Progress Notes (Signed)
Patient ID: Carol Jackson, female   DOB: [birth year], 68 y.o.   MRN: [id number] [facility] Surgery Progress Note:   3 Days Post-Op  Subjective: Mental status is clear.  Feeling better.  Urine output is up Objective: Vital signs in last 24 hours: Temp:  [97.5 F (36.4 C)-98.2 F (36.8 C)] 97.5 F (36.4 C) (10/24 0510) Pulse Rate:  [73-84] 73 (10/24 0510) Resp:  [16-18] 18 (10/24 0510) BP: (141-164)/(68-70) 141/68 mmHg (10/24 0510) SpO2:  [94 %-98 %] 94 % (10/24 0510)  Intake/Output from previous day: 10/23 0701 - 10/24 0700 In: 2841.7 [I.V.:2841.7] Out: 3265 [Urine:3250; Drains:15] Intake/Output this shift:    Physical Exam: Work of breathing is normal.  JP drainage minimal.  Dressings dry  Lab Results:  Results for orders placed during the hospital encounter of 03/09/14 (from the past 48 hour(s))  CBC     Status: Abnormal   Collection Time    03/11/14  4:57 AM      Result Value Ref Range   WBC 19.4 (*) 4.0 - 10.5 K/uL   RBC 3.66 (*) 3.87 - 5.11 MIL/uL   Hemoglobin 10.0 (*) 12.0 - 15.0 g/dL   Comment: DELTA CHECK NOTED     REPEATED TO VERIFY   HCT 30.1 (*) 36.0 - 46.0 %   MCV 82.2  78.0 - 100.0 fL   MCH 27.3  26.0 - 34.0 pg   MCHC 33.2  30.0 - 36.0 g/dL   RDW 16.0 (*) 11.5 - 15.5 %   Platelets 283  150 - 400 K/uL  BASIC METABOLIC PANEL     Status: Abnormal   Collection Time    03/11/14  4:57 AM      Result Value Ref Range   Sodium 137  137 - 147 mEq/L   Potassium 4.3  3.7 - 5.3 mEq/L   Chloride 101  96 - 112 mEq/L   CO2 24  19 - 32 mEq/L   Glucose, Bld 156 (*) 70 - 99 mg/dL   BUN 25 (*) 6 - 23 mg/dL   Creatinine, Ser 0.83  0.50 - 1.10 mg/dL   Calcium 8.1 (*) 8.4 - 10.5 mg/dL   GFR calc non Af Amer 71 (*) >90 mL/min   GFR calc Af Amer 83 (*) >90 mL/min   Comment: (NOTE)     The eGFR has been calculated using the CKD EPI equation.     This calculation has not been validated in all clinical situations.     eGFR's persistently <90 mL/min signify possible  Chronic Kidney     Disease.   Anion gap 12  5 - 15  CBC     Status: Abnormal   Collection Time    03/12/14  5:00 AM      Result Value Ref Range   WBC 15.9 (*) 4.0 - 10.5 K/uL   RBC 3.27 (*) 3.87 - 5.11 MIL/uL   Hemoglobin 8.9 (*) 12.0 - 15.0 g/dL   HCT 26.7 (*) 36.0 - 46.0 %   MCV 81.7  78.0 - 100.0 fL   MCH 27.2  26.0 - 34.0 pg   MCHC 33.3  30.0 - 36.0 g/dL   RDW 16.1 (*) 11.5 - 15.5 %   Platelets 275  150 - 400 K/uL  BASIC METABOLIC PANEL     Status: Abnormal   Collection Time    03/12/14  5:00 AM      Result Value Ref Range   Sodium 139  137 - 147 mEq/L  Potassium 4.0  3.7 - 5.3 mEq/L   Chloride 104  96 - 112 mEq/L   CO2 26  19 - 32 mEq/L   Glucose, Bld 132 (*) 70 - 99 mg/dL   BUN 12  6 - 23 mg/dL   Comment: DELTA CHECK NOTED   Creatinine, Ser 0.62  0.50 - 1.10 mg/dL   Calcium 8.3 (*) 8.4 - 10.5 mg/dL   GFR calc non Af Amer >90  >90 mL/min   GFR calc Af Amer >90  >90 mL/min   Comment: (NOTE)     The eGFR has been calculated using the CKD EPI equation.     This calculation has not been validated in all clinical situations.     eGFR's persistently <90 mL/min signify possible Chronic Kidney     Disease.   Anion gap 9  5 - 15    Radiology/Results: No results found.  Anti-infectives: Anti-infectives   Start     Dose/Rate Route Frequency Ordered Stop   03/10/14 0200  cefoTEtan (CEFOTAN) 2 g in dextrose 5 % 50 mL IVPB     2 g 100 mL/hr over 30 Minutes Intravenous Every 12 hours 03/09/14 1957 03/10/14 0146   03/09/14 1100  cefoTEtan (CEFOTAN) 2 g in dextrose 5 % 50 mL IVPB     2 g 100 mL/hr over 30 Minutes Intravenous On call to O.R. 03/09/14 1058 03/09/14 1355      Assessment/Plan: Problem List: Patient Active Problem List   Diagnosis Date Noted  . S/P colostomy takedown Oct 2015 03/09/2014  . Postoperative wound infection 11/17/2013  . 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 Diabetes 09/29/2013  . Thoracotomy  scar of right chest-decortication at [facility] 09/29/2013  . 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 Obesity, unspecified 10/22/2012    Hg has drifted down to 8.9.  Will hold heparin today.  She had flatus and will offer bariatric clears.   3 Days Post-Op    LOS: 3 days   Matt B. Hassell Done, MD, [facility] [phone] beeper [phone]  03/12/2014 7:37 AM

## 2014-03-13 LAB — BASIC METABOLIC PANEL
ANION GAP: 11 (ref 5–15)
BUN: 9 mg/dL (ref 6–23)
CALCIUM: 8.4 mg/dL (ref 8.4–10.5)
CO2: 27 mEq/L (ref 19–32)
Chloride: 100 mEq/L (ref 96–112)
Creatinine, Ser: 0.62 mg/dL (ref 0.50–1.10)
GFR calc non Af Amer: >90 mL/min (ref >90)
Glucose, Bld: 134 mg/dL — ABNORMAL HIGH (ref 70–99)
POTASSIUM: 4 meq/L (ref 3.7–5.3)
Sodium: 138 mEq/L (ref 137–147)

## 2014-03-13 LAB — CBC
HCT: 26.8 % — ABNORMAL LOW (ref 36.0–46.0)
Hemoglobin: 8.7 g/dL — ABNORMAL LOW (ref 12.0–15.0)
MCH: 26.8 pg (ref 26.0–34.0)
MCHC: 32.5 g/dL (ref 30.0–36.0)
MCV: 82.5 fL (ref 78.0–100.0)
PLATELETS: 308 10*3/uL (ref 150–400)
RBC: 3.25 MIL/uL — ABNORMAL LOW (ref 3.87–5.11)
RDW: 16.1 % — AB (ref 11.5–15.5)
WBC: 12.8 10*3/uL — ABNORMAL HIGH (ref 4.0–10.5)

## 2014-03-13 NOTE — Progress Notes (Signed)
4 Days Post-Op  Subjective: No complaints. Feels better  Objective: Vital signs in last 24 hours: Temp:  [98.4 F (36.9 C)-98.9 F (37.2 C)] 98.4 F (36.9 C) (10/25 0515) Pulse Rate:  [72-78] 72 (10/25 0515) Resp:  [16-18] 18 (10/25 0515) BP: (154-168)/(67-76) 154/70 mmHg (10/25 0515) SpO2:  [96 %-99 %] 99 % (10/25 0515) Last BM Date: 03/08/14  Intake/Output from previous day: 10/24 0701 - 10/25 0700 In: 3500 [I.V.:3500] Out: 5220 [Urine:5150; Drains:70] Intake/Output this shift:    Resp: clear to auscultation bilaterally Cardio: regular rate and rhythm GI: soft, mild tenderness. good bs. drain output bloody but minimal volume  Lab Results:   Recent Labs  03/12/14 0500 03/13/14 0609  WBC 15.9* 12.8*  HGB 8.9* 8.7*  HCT 26.7* 26.8*  PLT 275 308   BMET  Recent Labs  03/12/14 0500 03/13/14 0609  NA 139 138  K 4.0 4.0  CL 104 100  CO2 26 27  GLUCOSE 132* 134*  BUN 12 9  CREATININE 0.62 0.62  CALCIUM 8.3* 8.4   PT/INR No results found for this basename: LABPROT, INR,  in the last 72 hours ABG No results found for this basename: PHART, PCO2, PO2, HCO3,  in the last 72 hours  Studies/Results: No results found.  Anti-infectives: Anti-infectives   Start     Dose/Rate Route Frequency Ordered Stop   03/10/14 0200  cefoTEtan (CEFOTAN) 2 g in dextrose 5 % 50 mL IVPB     2 g 100 mL/hr over 30 Minutes Intravenous Every 12 hours 03/09/14 1957 03/10/14 0146   03/09/14 1100  cefoTEtan (CEFOTAN) 2 g in dextrose 5 % 50 mL IVPB     2 g 100 mL/hr over 30 Minutes Intravenous On call to O.R. 03/09/14 1058 03/09/14 1355      Assessment/Plan: Carol [name] Procedure(Carol Jackson): LAPAROSCOPIC/OPEN TAKEDOWN OF HARTMAN POUCH (N/A) Hg stable. continue to hold anticoagulation D/C foley Advance to full liquid diet ambulate  LOS: 4 days    Carol Carol Jackson,Carol Carol Jackson 03/13/2014

## 2014-03-14 LAB — CBC
HCT: 27.2 % — ABNORMAL LOW (ref 36.0–46.0)
HEMOGLOBIN: 9 g/dL — AB (ref 12.0–15.0)
MCH: 27 pg (ref 26.0–34.0)
MCHC: 33.1 g/dL (ref 30.0–36.0)
MCV: 81.7 fL (ref 78.0–100.0)
Platelets: 349 10*3/uL (ref 150–400)
RBC: 3.33 MIL/uL — ABNORMAL LOW (ref 3.87–5.11)
RDW: 15.8 % — ABNORMAL HIGH (ref 11.5–15.5)
WBC: 12.9 10*3/uL — AB (ref 4.0–10.5)

## 2014-03-14 LAB — BASIC METABOLIC PANEL
ANION GAP: 11 (ref 5–15)
BUN: 10 mg/dL (ref 6–23)
CHLORIDE: 100 meq/L (ref 96–112)
CO2: 27 mEq/L (ref 19–32)
CREATININE: 0.66 mg/dL (ref 0.50–1.10)
Calcium: 8.9 mg/dL (ref 8.4–10.5)
GFR calc Af Amer: >90 mL/min (ref >90)
GFR calc non Af Amer: 89 mL/min — ABNORMAL LOW (ref >90)
Glucose, Bld: 122 mg/dL — ABNORMAL HIGH (ref 70–99)
Potassium: 4 mEq/L (ref 3.7–5.3)
SODIUM: 138 meq/L (ref 137–147)

## 2014-03-14 NOTE — Progress Notes (Signed)
Patient ID: Carol Jackson, female   DOB: 08/26/1945, 68 y.o.   MRN: [id number] [facility] Surgery Progress Note:   5 Days Post-Op  Subjective: Mental status is clear Objective: Vital signs in last 24 hours: Temp:  [98 F (36.7 C)-98.4 F (36.9 C)] 98 F (36.7 C) (10/26 0510) Pulse Rate:  [78-80] 80 (10/26 0510) Resp:  [18] 18 (10/26 0510) BP: (159-169)/(69-75) 159/69 mmHg (10/26 0510) SpO2:  [96 %-97 %] 96 % (10/26 0510)  Intake/Output from previous day: 10/25 0701 - 10/26 0700 In: 1425 [I.V.:1425] Out: 1520 [Urine:1400; Drains:120] Intake/Output this shift: Total I/O In: -  Out: 450 [Urine:450]  Physical Exam: Work of breathing is not labored.  Incision covered.    Lab Results:  Results for orders placed during the hospital encounter of 03/09/14 (from the past 48 hour(s))  BASIC METABOLIC PANEL     Status: Abnormal   Collection Time    03/13/14  6:09 AM      Result Value Ref Range   Sodium 138  137 - 147 mEq/L   Potassium 4.0  3.7 - 5.3 mEq/L   Chloride 100  96 - 112 mEq/L   CO2 27  19 - 32 mEq/L   Glucose, Bld 134 (*) 70 - 99 mg/dL   BUN 9  6 - 23 mg/dL   Creatinine, Ser 0.62  0.50 - 1.10 mg/dL   Calcium 8.4  8.4 - 10.5 mg/dL   GFR calc non Af Amer >90  >90 mL/min   GFR calc Af Amer >90  >90 mL/min   Comment: (NOTE)     The eGFR has been calculated using the CKD EPI equation.     This calculation has not been validated in all clinical situations.     eGFR's persistently <90 mL/min signify possible Chronic Kidney     Disease.   Anion gap 11  5 - 15  CBC     Status: Abnormal   Collection Time    03/13/14  6:09 AM      Result Value Ref Range   WBC 12.8 (*) 4.0 - 10.5 K/uL   RBC 3.25 (*) 3.87 - 5.11 MIL/uL   Hemoglobin 8.7 (*) 12.0 - 15.0 g/dL   HCT 26.8 (*) 36.0 - 46.0 %   MCV 82.5  78.0 - 100.0 fL   MCH 26.8  26.0 - 34.0 pg   MCHC 32.5  30.0 - 36.0 g/dL   RDW 16.1 (*) 11.5 - 15.5 %   Platelets 308  150 - 400 K/uL  BASIC METABOLIC PANEL     Status:  Abnormal   Collection Time    03/14/14  4:59 AM      Result Value Ref Range   Sodium 138  137 - 147 mEq/L   Potassium 4.0  3.7 - 5.3 mEq/L   Chloride 100  96 - 112 mEq/L   CO2 27  19 - 32 mEq/L   Glucose, Bld 122 (*) 70 - 99 mg/dL   BUN 10  6 - 23 mg/dL   Creatinine, Ser 0.66  0.50 - 1.10 mg/dL   Calcium 8.9  8.4 - 10.5 mg/dL   GFR calc non Af Amer 89 (*) >90 mL/min   GFR calc Af Amer >90  >90 mL/min   Comment: (NOTE)     The eGFR has been calculated using the CKD EPI equation.     This calculation has not been validated in all clinical situations.     eGFR's persistently <90 mL/min  signify possible Chronic Kidney     Disease.   Anion gap 11  5 - 15  CBC     Status: Abnormal   Collection Time    03/14/14  4:59 AM      Result Value Ref Range   WBC 12.9 (*) 4.0 - 10.5 K/uL   RBC 3.33 (*) 3.87 - 5.11 MIL/uL   Hemoglobin 9.0 (*) 12.0 - 15.0 g/dL   HCT 27.2 (*) 36.0 - 46.0 %   MCV 81.7  78.0 - 100.0 fL   MCH 27.0  26.0 - 34.0 pg   MCHC 33.1  30.0 - 36.0 g/dL   RDW 15.8 (*) 11.5 - 15.5 %   Platelets 349  150 - 400 K/uL    Radiology/Results: No results found.  Anti-infectives: Anti-infectives   Start     Dose/Rate Route Frequency Ordered Stop   03/10/14 0200  cefoTEtan (CEFOTAN) 2 g in dextrose 5 % 50 mL IVPB     2 g 100 mL/hr over 30 Minutes Intravenous Every 12 hours 03/09/14 1957 03/10/14 0146   03/09/14 1100  cefoTEtan (CEFOTAN) 2 g in dextrose 5 % 50 mL IVPB     2 g 100 mL/hr over 30 Minutes Intravenous On call to O.R. 03/09/14 1058 03/09/14 1355      Assessment/Plan: Problem List: Patient Active Problem List   Diagnosis Date Noted  . S/P colostomy takedown Oct 2015 03/09/2014  . Postoperative wound infection 11/17/2013  . 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 Diabetes 09/29/2013  . Thoracotomy scar of right chest-[facility]ortication at [facility] 09/29/2013  . Post-pancreatectomy diabetes-splenectomy at [facility] 09/29/2013  .  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 Obesity, unspecified 10/22/2012    Taking po and having flatus.  On fulls.  HG appears stable.   5 Days Post-Op    LOS: 5 days   Matt B. Hassell Done, MD, [facility] [phone] beeper [phone]  03/14/2014 12:01 PM

## 2014-03-15 LAB — BASIC METABOLIC PANEL
Anion gap: 11 (ref 5–15)
BUN: 10 mg/dL (ref 6–23)
CO2: 27 mEq/L (ref 19–32)
Calcium: 8.9 mg/dL (ref 8.4–10.5)
Chloride: 101 mEq/L (ref 96–112)
Creatinine, Ser: 0.73 mg/dL (ref 0.50–1.10)
GFR, EST NON AFRICAN AMERICAN: 86 mL/min — AB (ref >90)
GLUCOSE: 113 mg/dL — AB (ref 70–99)
POTASSIUM: 3.8 meq/L (ref 3.7–5.3)
SODIUM: 139 meq/L (ref 137–147)

## 2014-03-15 LAB — CBC
HCT: 27.2 % — ABNORMAL LOW (ref 36.0–46.0)
Hemoglobin: 9 g/dL — ABNORMAL LOW (ref 12.0–15.0)
MCH: 27.6 pg (ref 26.0–34.0)
MCHC: 33.1 g/dL (ref 30.0–36.0)
MCV: 83.4 fL (ref 78.0–100.0)
PLATELETS: 347 10*3/uL (ref 150–400)
RBC: 3.26 MIL/uL — AB (ref 3.87–5.11)
RDW: 15.7 % — ABNORMAL HIGH (ref 11.5–15.5)
WBC: 14 10*3/uL — AB (ref 4.0–10.5)

## 2014-03-15 NOTE — Progress Notes (Signed)
Patient ID: Carol Jackson, female   DOB: 09/06/1945, 68 y.o.   MRN: [id number] [facility] Surgery Progress Note:   6 Days Post-Op  Subjective: Mental status is clear.  Has been up walking Objective: Vital signs in last 24 hours: Temp:  [98.1 F (36.7 C)-98.2 F (36.8 C)] 98.2 F (36.8 C) (10/27 0454) Pulse Rate:  [69-75] 69 (10/27 0454) Resp:  [16-18] 16 (10/27 0454) BP: (148)/(61-66) 148/61 mmHg (10/27 0454) SpO2:  [97 %-98 %] 98 % (10/27 0454)  Intake/Output from previous day: 10/26 0701 - 10/27 0700 In: 1440 [P.O.:240; I.V.:1200] Out: 2380 [Urine:2350; Drains:30] Intake/Output this shift:    Physical Exam: Work of breathing is normal.  JP drainage decreased.  Dressing in place  Lab Results:  Results for orders placed during the hospital encounter of 03/09/14 (from the past 48 hour(s))  BASIC METABOLIC PANEL     Status: Abnormal   Collection Time    03/14/14  4:59 AM      Result Value Ref Range   Sodium 138  137 - 147 mEq/L   Potassium 4.0  3.7 - 5.3 mEq/L   Chloride 100  96 - 112 mEq/L   CO2 27  19 - 32 mEq/L   Glucose, Bld 122 (*) 70 - 99 mg/dL   BUN 10  6 - 23 mg/dL   Creatinine, Ser 0.66  0.50 - 1.10 mg/dL   Calcium 8.9  8.4 - 10.5 mg/dL   GFR calc non Af Amer 89 (*) >90 mL/min   GFR calc Af Amer >90  >90 mL/min   Comment: (NOTE)     The eGFR has been calculated using the CKD EPI equation.     This calculation has not been validated in all clinical situations.     eGFR's persistently <90 mL/min signify possible Chronic Kidney     Disease.   Anion gap 11  5 - 15  CBC     Status: Abnormal   Collection Time    03/14/14  4:59 AM      Result Value Ref Range   WBC 12.9 (*) 4.0 - 10.5 K/uL   RBC 3.33 (*) 3.87 - 5.11 MIL/uL   Hemoglobin 9.0 (*) 12.0 - 15.0 g/dL   HCT 27.2 (*) 36.0 - 46.0 %   MCV 81.7  78.0 - 100.0 fL   MCH 27.0  26.0 - 34.0 pg   MCHC 33.1  30.0 - 36.0 g/dL   RDW 15.8 (*) 11.5 - 15.5 %   Platelets 349  150 - 400 K/uL  BASIC METABOLIC  PANEL     Status: Abnormal   Collection Time    03/15/14  4:45 AM      Result Value Ref Range   Sodium 139  137 - 147 mEq/L   Potassium 3.8  3.7 - 5.3 mEq/L   Chloride 101  96 - 112 mEq/L   CO2 27  19 - 32 mEq/L   Glucose, Bld 113 (*) 70 - 99 mg/dL   BUN 10  6 - 23 mg/dL   Creatinine, Ser 0.73  0.50 - 1.10 mg/dL   Calcium 8.9  8.4 - 10.5 mg/dL   GFR calc non Af Amer 86 (*) >90 mL/min   GFR calc Af Amer >90  >90 mL/min   Comment: (NOTE)     The eGFR has been calculated using the CKD EPI equation.     This calculation has not been validated in all clinical situations.     eGFR's persistently <  90 mL/min signify possible Chronic Kidney     Disease.   Anion gap 11  5 - 15  CBC     Status: Abnormal   Collection Time    03/15/14  4:45 AM      Result Value Ref Range   WBC 14.0 (*) 4.0 - 10.5 K/uL   RBC 3.26 (*) 3.87 - 5.11 MIL/uL   Hemoglobin 9.0 (*) 12.0 - 15.0 g/dL   HCT 27.2 (*) 36.0 - 46.0 %   MCV 83.4  78.0 - 100.0 fL   MCH 27.6  26.0 - 34.0 pg   MCHC 33.1  30.0 - 36.0 g/dL   RDW 15.7 (*) 11.5 - 15.5 %   Platelets 347  150 - 400 K/uL    Radiology/Results: No results found.  Anti-infectives: Anti-infectives   Start     Dose/Rate Route Frequency Ordered Stop   03/10/14 0200  cefoTEtan (CEFOTAN) 2 g in dextrose 5 % 50 mL IVPB     2 g 100 mL/hr over 30 Minutes Intravenous Every 12 hours 03/09/14 1957 03/10/14 0146   03/09/14 1100  cefoTEtan (CEFOTAN) 2 g in dextrose 5 % 50 mL IVPB     2 g 100 mL/hr over 30 Minutes Intravenous On call to O.R. 03/09/14 1058 03/09/14 1355      Assessment/Plan: Problem List: Patient Active Problem List   Diagnosis Date Noted  . S/P colostomy takedown Oct 2015 03/09/2014  . Postoperative wound infection 11/17/2013  . 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 Diabetes 09/29/2013  . Thoracotomy scar of right chest-decortication at [facility] 09/29/2013  . Post-pancreatectomy diabetes[facility]splenectomy at  [facility] 09/29/2013  . 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 Obesity, unspecified 10/22/2012    Will advance diet, dc IV, dc JP.  Hopeful discharge tomorrow.   6 Days Post-Op    LOS: 6 days   Matt B. Hassell Done, MD, [facility] [phone] beeper [phone]  03/15/2014 7:49 AM

## 2014-03-16 LAB — BASIC METABOLIC PANEL
ANION GAP: 13 (ref 5–15)
BUN: 15 mg/dL (ref 6–23)
CO2: 26 meq/L (ref 19–32)
Calcium: 8.5 mg/dL (ref 8.4–10.5)
Chloride: 104 mEq/L (ref 96–112)
Creatinine, Ser: 0.81 mg/dL (ref 0.50–1.10)
GFR calc Af Amer: 85 mL/min — ABNORMAL LOW (ref >90)
GFR calc non Af Amer: 73 mL/min — ABNORMAL LOW (ref >90)
Glucose, Bld: 107 mg/dL — ABNORMAL HIGH (ref 70–99)
Potassium: 4.1 mEq/L (ref 3.7–5.3)
SODIUM: 143 meq/L (ref 137–147)

## 2014-03-16 LAB — CBC WITH DIFFERENTIAL/PLATELET
Basophils Absolute: 0 10*3/uL (ref 0.0–0.1)
Basophils Relative: 0 % (ref 0–1)
Eosinophils Absolute: 0.8 10*3/uL — ABNORMAL HIGH (ref 0.0–0.7)
Eosinophils Relative: 5 % (ref 0–5)
HEMATOCRIT: 25.8 % — AB (ref 36.0–46.0)
HEMOGLOBIN: 8.6 g/dL — AB (ref 12.0–15.0)
LYMPHS PCT: 27 % (ref 12–46)
Lymphs Abs: 3.8 10*3/uL (ref 0.7–4.0)
MCH: 27.4 pg (ref 26.0–34.0)
MCHC: 33.3 g/dL (ref 30.0–36.0)
MCV: 82.2 fL (ref 78.0–100.0)
MONO ABS: 1.7 10*3/uL — AB (ref 0.1–1.0)
MONOS PCT: 12 % (ref 3–12)
NEUTROS ABS: 7.8 10*3/uL — AB (ref 1.7–7.7)
Neutrophils Relative %: 56 % (ref 43–77)
Platelets: 409 10*3/uL — ABNORMAL HIGH (ref 150–400)
RBC: 3.14 MIL/uL — ABNORMAL LOW (ref 3.87–5.11)
RDW: 15.8 % — ABNORMAL HIGH (ref 11.5–15.5)
WBC: 14.2 10*3/uL — AB (ref 4.0–10.5)

## 2014-03-16 MED ORDER — HYDROCODONE-ACETAMINOPHEN 7.5-325 MG/15ML PO SOLN: 15.0000 mL | ORAL | Status: AC | PRN

## 2014-03-16 NOTE — Discharge Summary (Signed)
Physician Discharge Summary  Patient ID: Carol Jackson MRN: [id number] DOB/AGE: February 23, 1946 68 y.o.  Admit date: 03/09/2014 Discharge date: 03/16/2014  Admission Diagnoses:  Functioning diverting colostomy  Discharge Diagnoses:  Post takedown of colostomy  Active Problems:   S/P colostomy takedown Oct 2015   Surgery:  Colostomy closure  Discharged Condition: improved  Hospital Course:   Had surgery.  Passed flatus.  JP from wound removed.  Diet begun and advanced.  Ready for discharge.  Staples in place to be removed on Monday  Consults: none  Significant Diagnostic Studies: none    Discharge Exam: Blood pressure 153/70, pulse 80, temperature 98.4 F (36.9 C), temperature source Oral, resp. rate 16, height 5\' 5"  (1.651 m), weight 203 lb (92.08 kg), SpO2 99.00%. Incision is bland.  Staples in place  Disposition: 06-Home-Health Care Svc  Discharge Instructions   Call MD for:  redness, tenderness, or signs of infection (pain, swelling, redness, odor or green/yellow discharge around incision site)    Complete by:  As directed      Call MD for:  severe uncontrolled pain    Complete by:  As directed      Diet - low sodium heart healthy    Complete by:  As directed      Discharge instructions    Complete by:  As directed   Follow bariatric diet     Discharge wound care:    Complete by:  As directed   May shower and shampoo.  Have nurse only visit on Monday for staple removal.     Increase activity slowly    Complete by:  As directed             Medication List         acetaminophen 500 MG tablet  Commonly known as:  TYLENOL  Take 1,000 mg by mouth once as needed for mild pain or headache.     amLODipine 5 MG tablet  Commonly known as:  NORVASC  Take 5 mg by mouth at bedtime.     B-12 2500 MCG Tabs  Take 1 tablet by mouth every morning.     CALCIUM 600 + D PO  Take 1 tablet by mouth 3 (three) times daily.     HYDROcodone-acetaminophen 7.5-325 mg/15 ml  solution  Commonly known as:  HYCET  Take 15 mLs by mouth every 4 (four) hours as needed for moderate pain.     levothyroxine 50 MCG tablet  Commonly known as:  SYNTHROID, LEVOTHROID  Take 50 mcg by mouth daily before breakfast. @ 5 a.m     MULTIVITAMIN & MINERAL PO  Take 1 tablet by mouth 2 (two) times daily.     THERA TEARS ALLERGY OP  Place 1 drop into both eyes once as needed (dry eyes.).           Follow-up Information   Follow up with Johnathan Hausen B, MD In 2 weeks.   Specialty:  General Surgery   Contact information:   [address] [place] [address] [phone]       Signed: Pedro Earls 03/16/2014, 8:31 AM

## 2014-04-26 ENCOUNTER — Encounter: Payer: Medicare Other | Attending: Surgery | Admitting: Dietician

## 2014-04-26 DIAGNOSIS — E669 Obesity, unspecified: Secondary | ICD-10-CM | POA: Diagnosis present

## 2014-04-26 DIAGNOSIS — Z713 Dietary counseling and surveillance: Secondary | ICD-10-CM | POA: Diagnosis not present

## 2014-04-26 DIAGNOSIS — Z9884 Bariatric surgery status: Secondary | ICD-10-CM | POA: Insufficient documentation

## 2014-04-26 NOTE — Patient Instructions (Addendum)
Goals:  Follow Phase 3B: High Protein + Non-Starchy Vegetables  Eat 3-6 small meals/snacks, every 3-5 hrs  Increase lean protein foods to meet 60g goal  Increase fluid intake to 64oz +  Avoid drinking 15 minutes before, during and 30 minutes after eating  Continue daily exercise (around 60 minutes)  Look in to joining a gym in [place]  Try using PB2 (add water until you have a paste). Have some on celery.  Add vegetables at meal times to help fill you up.  Contact [employer] to see if you can start in April [phone] or email [email or web address]

## 2014-04-26 NOTE — Progress Notes (Signed)
   Follow-up visit:  6 Months Post-Operative RYGB Surgery  Medical Nutrition Therapy:  Appt start time: 128 end time: 935 .  Primary concerns today: Post-operative Bariatric Surgery Nutrition Management. Returns with a 8.5 lbs weight loss. Has surgery on 03/09/14 to reconnect colon. Has a hematoma that is being drained weekly.  Going to [place] on 05/16/14 for 3 months.   [place]olong[place] writing down food and has been travelling a lot. Splitting meals with husband when going out to eat.   Surgery date: 10/19/2013 Surgery type: RYGB Start weight at [facility]: 241.5 lbs on 12/03/12  Weight today: 198.0 lbs  Weight change: 8.5 lbs Total weight loss: 44 lbs  TANITA  BODY COMP RESULTS  10/04/2013 12/21/13 01/25/14 04/26/14   BMI (kg/m^2) 41.7 34.9 34.4 32.9   Fat Mass (lbs) 135 106.0 104.5 99.5   Fat Free Mass (lbs) 115.5 104.0 102.0 98.5   Total Body Water (lbs) 84.5 76.0 74.5 72.0    Preferred Learning Style:   No preference indicated   Learning Readiness:   Ready  24-hr recall: B (AM): Premier Protein (30 g) or 1 egg and bacon on Sunday Snk (AM): Baby bell or string cheese (6 g)  L (PM): [place] yogurt, 1 oz jerky, or peanut butter sometimes on rice cake (3 x week) (4-12 g) Snk (PM): Baby bell or string cheese (6 g)  D (PM): 1/2 cup cottage cheese with spaghetti sauce (14 g)  Snk (PM): none or jerky (0-7g)  Fluid intake: 8-16 oz decaf coffee, water, 34 oz water, 48 sugar free lemonade, 11 oz sometimes protein shake (100 oz) Estimated total protein intake: 60-75 g   Medications: synthroid Supplementation: taking   CBG monitoring: every day fasting Average CBG per patient: 105 - 119 mg/dl Last patient reported A1c: unsure of last Hgb A1c  Using straws: at night time  Drinking while eating: sometimes, drinking within 30 minutes after eating Hair loss: No Carbonated beverages: No N/V/D/C: coffeemate creamer gives her diarrhea Dumping syndrome: No  Recent physical activity:  Walking  about 3-5 miles total per day (2 x day) 45-60 minutes total  Progress Towards Goal(s):  In progress.    Nutritional Diagnosis:  [place]-3.3 Overweight/obesity related to past poor dietary habits and physical inactivity as evidenced by patient w/ recent RYGB surgery following dietary guidelines for continued weight loss.    Intervention:  Nutrition education/diet reinforecment. Goals:  Follow Phase 3B: High Protein + Non-Starchy Vegetables  Eat 3-6 small meals/snacks, every 3-5 hrs  Increase lean protein foods to meet 60g goal  Increase fluid intake to 64oz +  Avoid drinking 15 minutes before, during and 30 minutes after eating  Continue daily exercise (around 60 minutes)  Look in to joining a gym in [place]  Try using PB2 (add water until you have a paste). Have some on celery.  Add vegetables at meal times to help fill you up.  Contact [employer] to see if you can start in April [phone] or email [email or web address]  Teaching Method Utilized:  Visual Auditory Hands on  Barriers to learning/adherence to lifestyle change: recovering from secondary surgery to colon  Demonstrated degree of understanding via:  Teach Back   Monitoring/Evaluation:  Dietary intake, exercise, and body weight. Follow up in 4 months for 10 month post-op visit.

## 2014-09-06 ENCOUNTER — Encounter: Payer: Medicare Other | Attending: Surgery | Admitting: Dietician

## 2014-09-06 VITALS — Ht 65.0 in | Wt 204.5 lb

## 2014-09-06 DIAGNOSIS — Z713 Dietary counseling and surveillance: Secondary | ICD-10-CM | POA: Diagnosis not present

## 2014-09-06 DIAGNOSIS — Z6834 Body mass index (BMI) 34.0-34.9, adult: Secondary | ICD-10-CM | POA: Diagnosis not present

## 2014-09-06 DIAGNOSIS — E669 Obesity, unspecified: Secondary | ICD-10-CM | POA: Diagnosis not present

## 2014-09-06 NOTE — Patient Instructions (Addendum)
Goals:  Follow Phase 3B: High Protein + Non-Starchy Vegetables  Eat 3-6 small meals/snacks, every 3-5 hrs  Increase lean protein foods to meet 60g goal  Increase fluid intake to 64oz +  Avoid drinking 15 minutes before, during and 30 minutes after eating  Continue daily exercise (around 60 minutes)  Add vegetables at meal times to help fill you up.  Have one shake a day  For lunch have lean protein (tuna, crab meat, with non greek yogurt or light mayonnaise) with some vegetables  Overall, eat protein foods first and then non starchy starchy vegetable (limit carbs)

## 2014-09-06 NOTE — Progress Notes (Signed)
   Follow-up visit:  10 Months Post-Operative RYGB Surgery  Medical Nutrition Therapy:  Appt start time: 900 end time: 930 .  Primary concerns today: Post-operative Bariatric Surgery Nutrition Management. Returns with a 6.5 lbs weight gain. Still has hematoma and Dr. Hassell Done is treating it. Has not been following diet plan closely especially in [place]. Joining the gym at the hospital in [facility]. Has trouble eating lettuce.  Surgery date: 10/19/2013 Surgery type: RYGB Start weight at [facility]: 241.5 lbs on 12/03/12  Weight today: 204.5 lbs lbs  Weight change: 6.5 lbs gain Total weight loss: 37.5 lbs Weight loss goal: 175 lbs  TANITA  BODY COMP RESULTS  10/04/2013 12/21/13 01/25/14 04/26/14 09/06/14   BMI (kg/m^2) 41.7 34.9 34.4 32.9 34.0   Fat Mass (lbs) 135 106.0 104.5 99.5 106.0   Fat Free Mass (lbs) 115.5 104.0 102.0 98.5 98.5   Total Body Water (lbs) 84.5 76.0 74.5 72.0 72.0    Preferred Learning Style:   No preference indicated   Learning Readiness:   Ready  24-hr recall: B (AM): Premier Protein (30 g) or 1 egg and bacon on Sunday Snk (AM): Baby bell or string cheese (6 g)  L (PM): Protein Shake or bacon/cheese [place] yogurt, 1 oz jerky, or pb2 with celery (6-30 g) Snk (PM):  1 oz Beef jerky (6 g)  D (PM): 3 oz hamburger with vegetable (21 g)  Snk (PM): none or carb smart fudgicle  Fluid intake: 8-16 oz decaf coffee, water, 34 oz water, 48 sugar free lemonade, 11 oz sometimes protein shake (100 oz) Estimated total protein intake: 69-93 g   Medications: synthroid Supplementation: taking   CBG monitoring: every day fasting Average CBG per patient: 95-105 mg/dl Last patient reported A1c: 6.0%  Using straws: No  Drinking while eating: Yes Hair loss: No Carbonated beverages: No N/V/D/C: No Dumping syndrome: No  Recent physical activity:  Walking about 3-5 miles total per day  60 minutes total  Progress Towards Goal(s):  In progress.    Nutritional Diagnosis:  [place]-3.3  Overweight/obesity related to past poor dietary habits and physical inactivity as evidenced by patient w/ recent RYGB surgery following dietary guidelines for continued weight loss.    Intervention:  Nutrition education/diet reinforecment. Goals:  Follow Phase 3B: High Protein + Non-Starchy Vegetables  Eat 3-6 small meals/snacks, every 3-5 hrs  Increase lean protein foods to meet 60g goal  Increase fluid intake to 64oz +  Avoid drinking 15 minutes before, during and 30 minutes after eating  Continue daily exercise (around 60 minutes)  Add vegetables at meal times to help fill you up.  Have one shake a day  For lunch have lean protein (tuna, crab meat, with non greek yogurt or light mayonnaise) with some vegetables  Overall, eat protein foods first and then non starchy starchy vegetable (limit carbs)  Teaching Method Utilized:  Visual Auditory Hands on  Barriers to learning/adherence to lifestyle change: able to tolerate carbs  Demonstrated degree of understanding via:  Teach Back   Monitoring/Evaluation:  Dietary intake, exercise, and body weight. Follow up in 2 months for 12 month post-op visit.

## 2014-11-07 ENCOUNTER — Ambulatory Visit: Payer: Medicare Other | Admitting: Dietician

## 2015-10-31 IMAGING — RF DG UGI W/ GASTROGRAFIN
8 of 10 series · 15 of 24 positions shown · IV contrast (omnipaque)
Comparison: Esophagram from 11/26/2012

FLUOROSCOPY TIME:  59 seconds

CLINICAL DATA: Status post gastric bypass

EXAM:
WATER SOLUBLE UPPER GI SERIES
TECHNIQUE: Single-column upper GI series was performed using water soluble
contrast.
CONTRAST:  35mL OMNIPAQUE IOHEXOL 300 MG/ML  SOLN

[Series 1: run · 2 of 26 slices shown (1 of 7)]
[im 1/26]
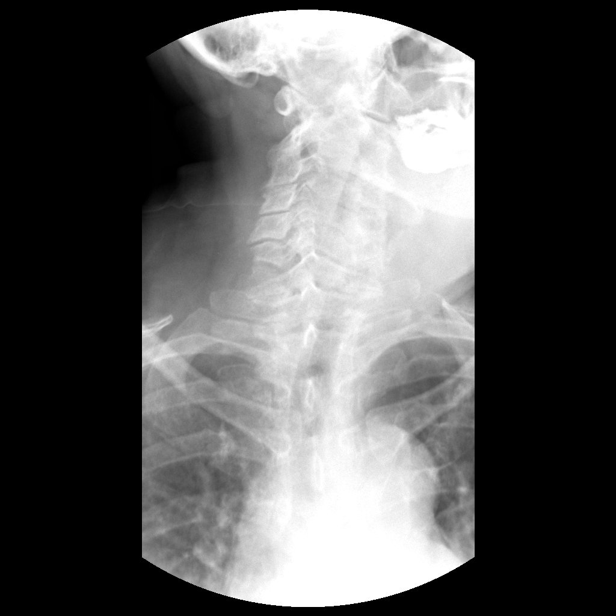
[im 17/26]
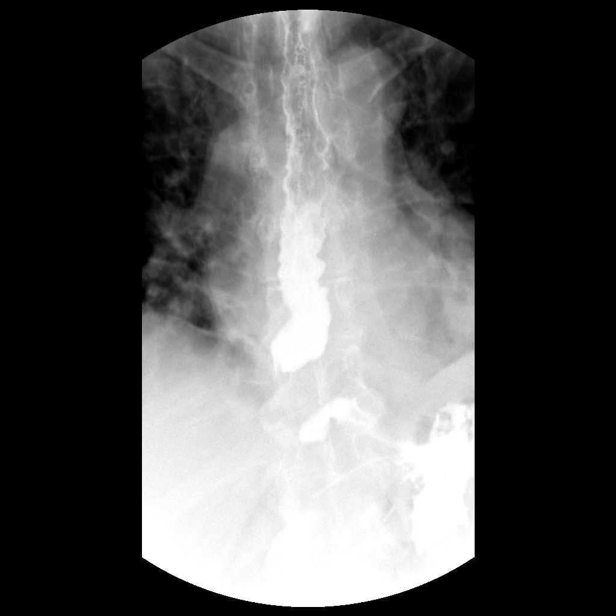

[Series 2: run · 2 of 18 slices shown (2 of 7)]
[im 1/18]
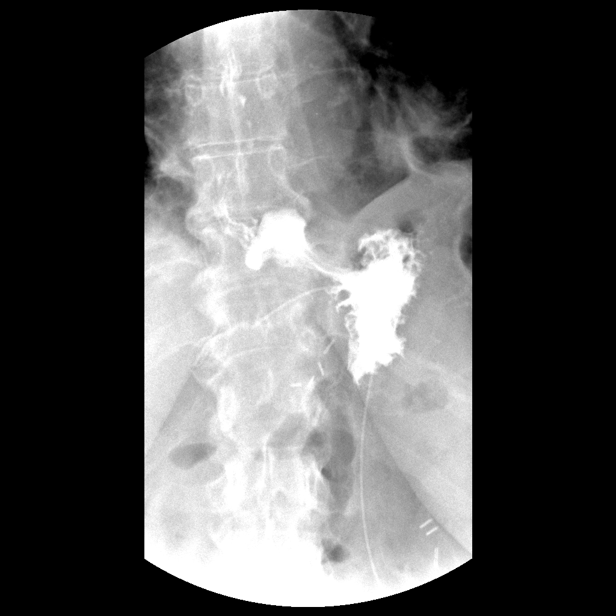
[im 18/18]
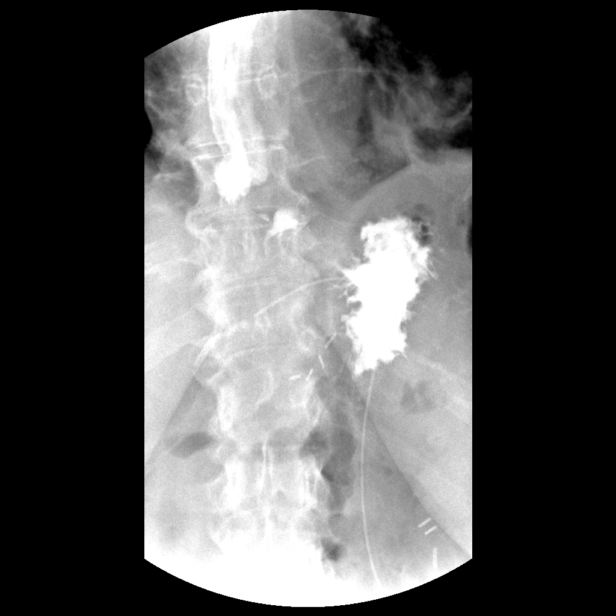

[Series 3: run · 3 of 31 slices shown (3 of 7)]
[im 8/31]
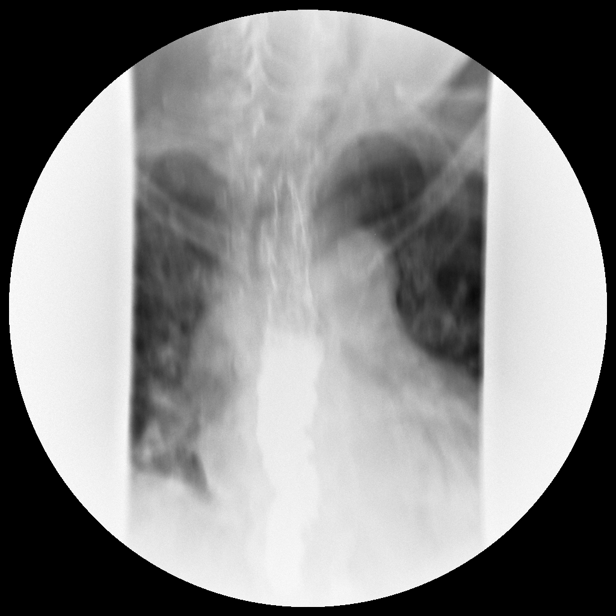
[im 16/31]
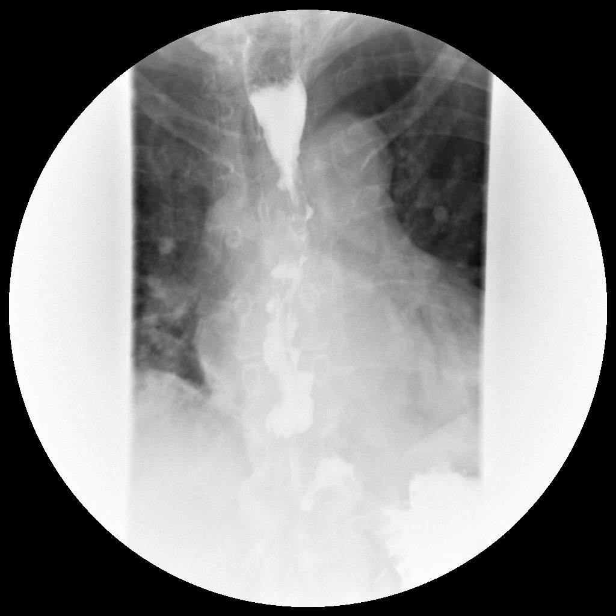
[im 31/31]
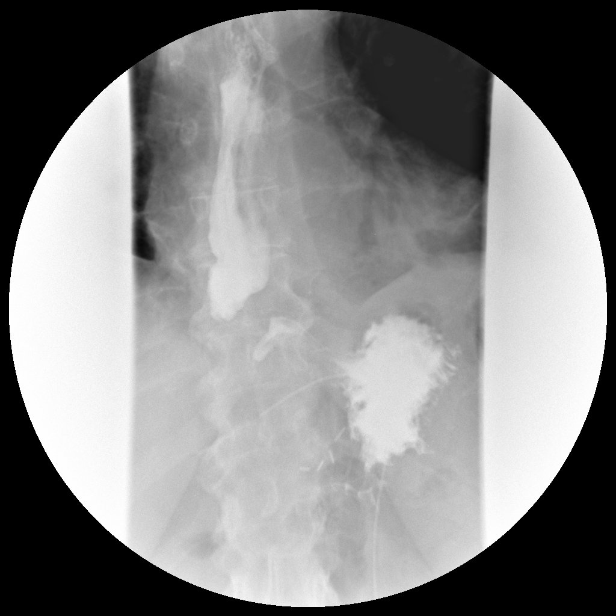

[Series 5: run · 2 of 8 slices shown (4 of 7)]
[im 1/8]
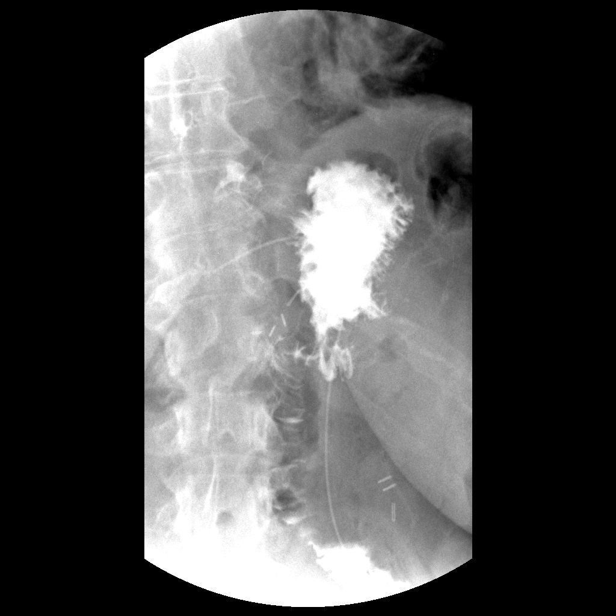
[im 8/8]
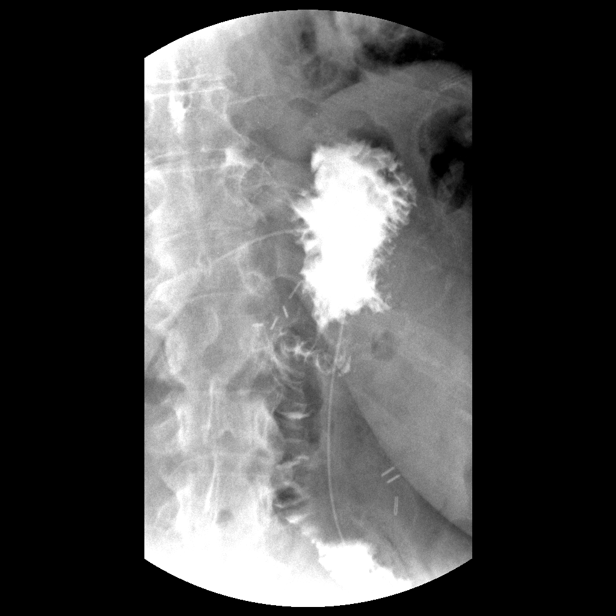

[Series 6: run · 1 of 8 slices shown (5 of 7)]
[im 8/8]
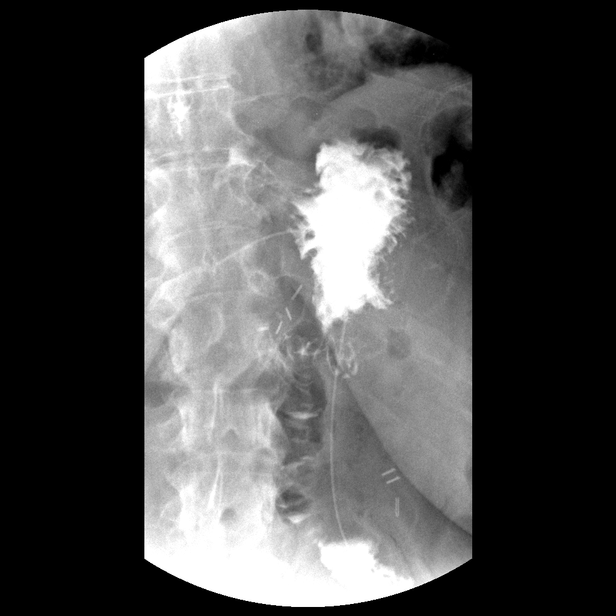

[Series 7: run · 1 of 10 slices shown (6 of 7)]
[im 1/10]
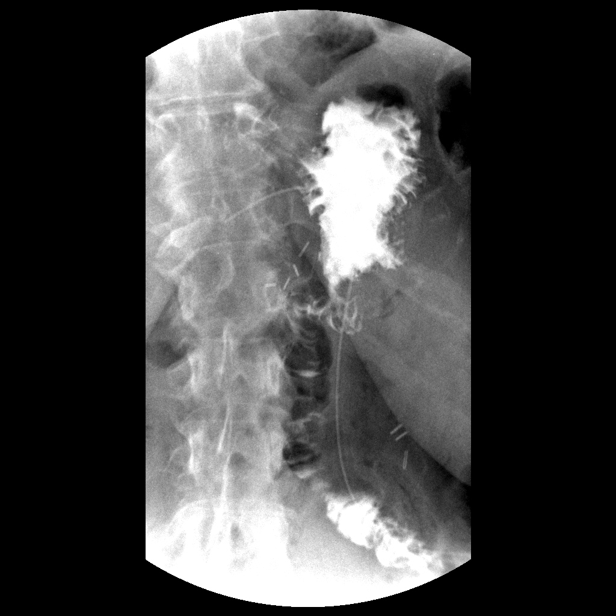

[Series 8: run · 3 of 21 slices shown (7 of 7)]
[im 1/21]
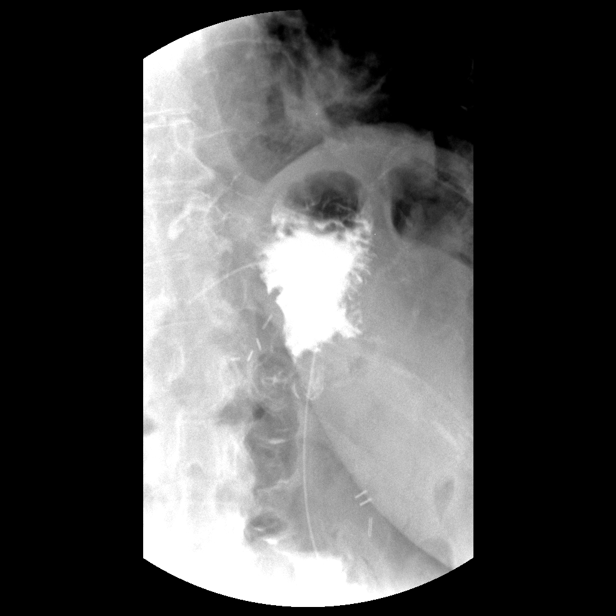
[im 14/21]
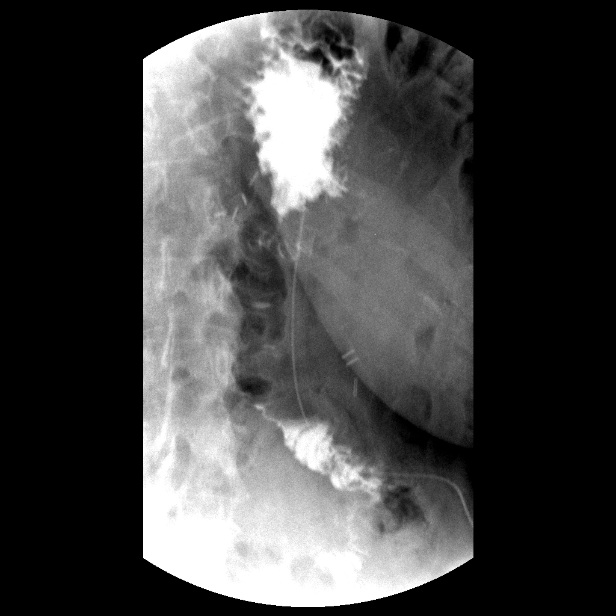
[im 21/21]
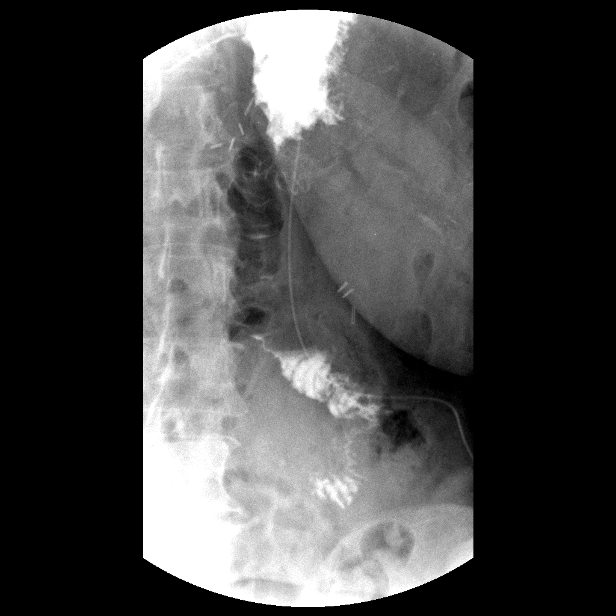

[Series 1002: view not recorded · 0.20mm/px · 1 of 1 slices shown]
[im 1/1]
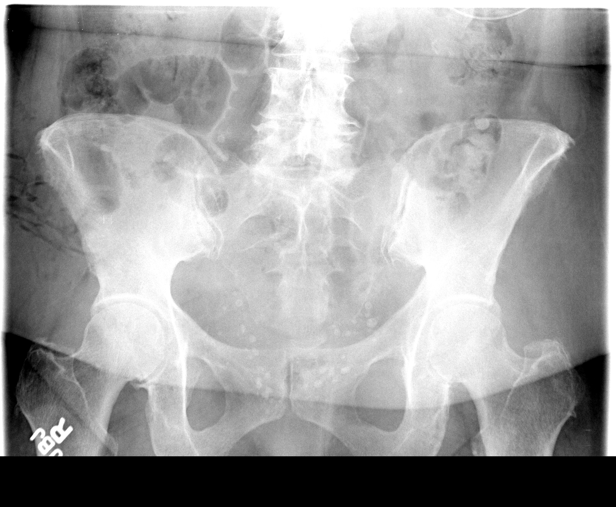

[15 of 24 positions shown; findings below may reference images not displayed]

FINDINGS: Tertiary contractions were identified intermittently during the
swallowing phase of the examination suggestive of presbyesophagus. A
small hiatal hernia is again identified but this appears reduced
from 11/26/2012. Postoperative appearance of the stomach is
identified compatible with gastric bypass. No extravasation of
contrast material identified suggestive of leakage. The gastro
jejunal anastomosis appears patent.
IMPRESSION: 1. No abnormal extravasation of contrast material identified to
suggest leak.

2.  Again identified are chronic changes of presbyesophagus.

3.  Small hiatal hernia.

## 2017-05-26 ENCOUNTER — Encounter (HOSPITAL_COMMUNITY): Payer: Self-pay

## 2019-01-19 DEATH — deceased
